# Patient Record
Sex: Female | Born: 1965
Health system: Southern US, Community
[De-identification: ages and names within clinical notes are randomized; demographics above are authoritative.]

## PROBLEM LIST (undated history)

## (undated) DIAGNOSIS — IMO0002 Reserved for concepts with insufficient information to code with codable children: Secondary | ICD-10-CM

## (undated) DIAGNOSIS — B977 Papillomavirus as the cause of diseases classified elsewhere: Secondary | ICD-10-CM

## (undated) DIAGNOSIS — M8430XA Stress fracture, unspecified site, initial encounter for fracture: Secondary | ICD-10-CM

## (undated) DIAGNOSIS — S82899A Other fracture of unspecified lower leg, initial encounter for closed fracture: Secondary | ICD-10-CM

## (undated) HISTORY — DX: Other fracture of unspecified lower leg, initial encounter for closed fracture: S82.899A

## (undated) HISTORY — DX: Reserved for concepts with insufficient information to code with codable children: IMO0002

## (undated) HISTORY — DX: Papillomavirus as the cause of diseases classified elsewhere: B97.7

## (undated) HISTORY — PX: TOTAL HIP ARTHROPLASTY: SHX124

## (undated) HISTORY — PX: TUBAL LIGATION: SHX77

## (undated) HISTORY — PX: ANKLE SURGERY: SHX546

## (undated) HISTORY — PX: COLPOSCOPY: SHX161

## (undated) HISTORY — DX: Stress fracture, unspecified site, initial encounter for fracture: M84.30XA

---

## 1998-04-06 ENCOUNTER — Other Ambulatory Visit: Admission: RE | Admit: 1998-04-06 | Discharge: 1998-04-06 | Payer: Self-pay | Admitting: *Deleted

## 1999-04-25 ENCOUNTER — Other Ambulatory Visit: Admission: RE | Admit: 1999-04-25 | Discharge: 1999-04-25 | Payer: Self-pay | Admitting: *Deleted

## 1999-06-08 ENCOUNTER — Other Ambulatory Visit: Admission: RE | Admit: 1999-06-08 | Discharge: 1999-06-08 | Payer: Self-pay | Admitting: *Deleted

## 1999-08-30 ENCOUNTER — Other Ambulatory Visit: Admission: RE | Admit: 1999-08-30 | Discharge: 1999-08-30 | Payer: Self-pay | Admitting: *Deleted

## 2001-01-29 HISTORY — PX: REFRACTIVE SURGERY: SHX103

## 2001-12-09 ENCOUNTER — Other Ambulatory Visit: Admission: RE | Admit: 2001-12-09 | Discharge: 2001-12-09 | Payer: Self-pay | Admitting: Obstetrics and Gynecology

## 2003-02-17 ENCOUNTER — Other Ambulatory Visit: Admission: RE | Admit: 2003-02-17 | Discharge: 2003-02-17 | Payer: Self-pay | Admitting: Obstetrics and Gynecology

## 2003-12-09 ENCOUNTER — Ambulatory Visit (HOSPITAL_COMMUNITY): Admission: RE | Admit: 2003-12-09 | Discharge: 2003-12-09 | Payer: Self-pay | Admitting: Obstetrics and Gynecology

## 2004-04-07 ENCOUNTER — Other Ambulatory Visit: Admission: RE | Admit: 2004-04-07 | Discharge: 2004-04-07 | Payer: Self-pay | Admitting: Obstetrics and Gynecology

## 2004-08-10 ENCOUNTER — Ambulatory Visit (HOSPITAL_BASED_OUTPATIENT_CLINIC_OR_DEPARTMENT_OTHER): Admission: RE | Admit: 2004-08-10 | Discharge: 2004-08-10 | Payer: Self-pay | Admitting: Obstetrics and Gynecology

## 2004-08-10 ENCOUNTER — Ambulatory Visit (HOSPITAL_COMMUNITY): Admission: RE | Admit: 2004-08-10 | Discharge: 2004-08-10 | Payer: Self-pay | Admitting: Obstetrics and Gynecology

## 2004-08-10 HISTORY — PX: OTHER SURGICAL HISTORY: SHX169

## 2004-12-20 ENCOUNTER — Ambulatory Visit (HOSPITAL_COMMUNITY): Admission: RE | Admit: 2004-12-20 | Discharge: 2004-12-20 | Payer: Self-pay | Admitting: Obstetrics and Gynecology

## 2005-08-22 ENCOUNTER — Other Ambulatory Visit: Admission: RE | Admit: 2005-08-22 | Discharge: 2005-08-22 | Payer: Self-pay | Admitting: Obstetrics and Gynecology

## 2005-12-31 ENCOUNTER — Ambulatory Visit (HOSPITAL_COMMUNITY): Admission: RE | Admit: 2005-12-31 | Discharge: 2005-12-31 | Payer: Self-pay | Admitting: Obstetrics and Gynecology

## 2006-08-28 ENCOUNTER — Other Ambulatory Visit: Admission: RE | Admit: 2006-08-28 | Discharge: 2006-08-28 | Payer: Self-pay | Admitting: Obstetrics and Gynecology

## 2007-01-10 ENCOUNTER — Ambulatory Visit (HOSPITAL_COMMUNITY): Admission: RE | Admit: 2007-01-10 | Discharge: 2007-01-10 | Payer: Self-pay | Admitting: Obstetrics and Gynecology

## 2007-09-12 ENCOUNTER — Other Ambulatory Visit: Admission: RE | Admit: 2007-09-12 | Discharge: 2007-09-12 | Payer: Self-pay | Admitting: Obstetrics and Gynecology

## 2008-01-13 ENCOUNTER — Ambulatory Visit (HOSPITAL_COMMUNITY): Admission: RE | Admit: 2008-01-13 | Discharge: 2008-01-13 | Payer: Self-pay | Admitting: Obstetrics and Gynecology

## 2008-06-02 ENCOUNTER — Ambulatory Visit: Payer: Self-pay | Admitting: Sports Medicine

## 2008-06-02 DIAGNOSIS — M629 Disorder of muscle, unspecified: Secondary | ICD-10-CM | POA: Insufficient documentation

## 2008-06-02 DIAGNOSIS — M25569 Pain in unspecified knee: Secondary | ICD-10-CM | POA: Insufficient documentation

## 2008-07-19 ENCOUNTER — Ambulatory Visit: Payer: Self-pay | Admitting: Sports Medicine

## 2008-07-19 DIAGNOSIS — M7750 Other enthesopathy of unspecified foot: Secondary | ICD-10-CM | POA: Insufficient documentation

## 2008-07-19 DIAGNOSIS — M2040 Other hammer toe(s) (acquired), unspecified foot: Secondary | ICD-10-CM

## 2008-07-19 DIAGNOSIS — S93409A Sprain of unspecified ligament of unspecified ankle, initial encounter: Secondary | ICD-10-CM | POA: Insufficient documentation

## 2009-01-14 ENCOUNTER — Ambulatory Visit (HOSPITAL_COMMUNITY): Admission: RE | Admit: 2009-01-14 | Discharge: 2009-01-14 | Payer: Self-pay | Admitting: Internal Medicine

## 2010-01-13 ENCOUNTER — Ambulatory Visit (HOSPITAL_COMMUNITY)
Admission: RE | Admit: 2010-01-13 | Discharge: 2010-01-13 | Payer: Self-pay | Source: Home / Self Care | Attending: Internal Medicine | Admitting: Internal Medicine

## 2010-06-16 NOTE — Op Note (Signed)
NAMEARLESIA, KIEL              ACCOUNT NO.:  [id number]   MEDICAL RECORD NO.:  [id number]          PATIENT TYPE:  AMB   LOCATION:  [facility]                         FACILITY:  [facility]   PHYSICIAN:  Cynthia P. Romine, M.D.DATE OF BIRTH:  Mar 18, 1965   DATE OF PROCEDURE:  08/10/2004  DATE OF DISCHARGE:                                 OPERATIVE REPORT   PREOPERATIVE DIAGNOSIS:  Menorrhagia.   POSTOPERATIVE DIAGNOSIS:  Menorrhagia.   PROCEDURE:  Endometrial ablation using the hydrothermal ablator.   SURGEON:  Cynthia P. Romine, M.D.   ANESTHESIA:  General by LMA.   ESTIMATED BLOOD LOSS:  25 cc.   COMPLICATIONS:  None.   PROCEDURE:  The patient was taken to the operating room and after the  induction of adequate general anesthesia by LMA was placed in the dorsal  lithotomy position and prepped and draped in the usual fashion.  A posterior  weighted and anterior Sims retractor were placed, and the cervix was grasped  on its anterior lip with a single-tooth tenaculum.  The sound was  introduced.  It would not pass through the internal os nor would the 13  Pratt dilator; therefore, the os finders were obtained, and the smallest os  finder was inserted into the os and did pass through the internal os into  the cavity.  A series of os finders were then used, and then a 13 Shawnie Pons  could be put into the endometrial cavity without difficulty.  The cervix was  then dilated to a #23 Shawnie Pons.  The scope was introduced but would not fit  through the endocervical canal easily, and the tenaculum ripped off the  anterior lip of the cervix.  The scope was withdrawn.  The cervix was re-  grasped with a tenaculum.  The cervix was dilated to a #25, and the scope  was reintroduced.  Again, the same difficulty was encountered.  The cervix  was grasped on the posterior lip and the anterior lip of the tenaculum.  The  cervix was dilated to a #27 Shawnie Pons, and the scope was introduced.  At this  point, it did pass  through the endocervix into the endometrial cavity.  Photographic documentation was taken of the endometrial cavity, including  the tubal ostia.  The scope was withdrawn to just inside the internal os,  and endometrial ablation was carried out in the usual fashion without  difficulty.  Photographic documentation was taken of the cavity at the end  of the procedure.  After the water was cooled,  the scope was withdrawn.  The instruments were removed from the vagina.  Both the anterior and  posterior lips of the cervix were noted to be bleeding from the tenaculum  sites.  These were each sutured with one suture of 2-0 chromic and  hemostasis was achieved.  The procedure was terminated.  The instruments  were removed from the vagina. The patient was taken to the recovery room in  satisfactory condition.     CPR/MEDQ  D:  08/10/2004  T:  08/10/2004  Job:  [id number]

## 2011-01-17 ENCOUNTER — Other Ambulatory Visit: Payer: Self-pay | Admitting: Obstetrics and Gynecology

## 2011-01-17 DIAGNOSIS — Z139 Encounter for screening, unspecified: Secondary | ICD-10-CM

## 2011-02-02 ENCOUNTER — Ambulatory Visit (HOSPITAL_COMMUNITY)
Admission: RE | Admit: 2011-02-02 | Discharge: 2011-02-02 | Disposition: A | Discharge status: Home / Self Care | Payer: BC Managed Care – PPO | Source: Ambulatory Visit | Attending: Obstetrics and Gynecology | Admitting: Obstetrics and Gynecology

## 2011-02-02 DIAGNOSIS — Z1231 Encounter for screening mammogram for malignant neoplasm of breast: Secondary | ICD-10-CM | POA: Insufficient documentation

## 2011-02-02 DIAGNOSIS — Z139 Encounter for screening, unspecified: Secondary | ICD-10-CM

## 2011-10-30 DIAGNOSIS — B977 Papillomavirus as the cause of diseases classified elsewhere: Secondary | ICD-10-CM

## 2011-10-30 HISTORY — DX: Papillomavirus as the cause of diseases classified elsewhere: B97.7

## 2012-01-17 ENCOUNTER — Other Ambulatory Visit: Payer: Self-pay | Admitting: Obstetrics and Gynecology

## 2012-01-17 DIAGNOSIS — Z139 Encounter for screening, unspecified: Secondary | ICD-10-CM

## 2012-02-04 ENCOUNTER — Ambulatory Visit (HOSPITAL_COMMUNITY): Payer: BC Managed Care – PPO

## 2012-02-08 ENCOUNTER — Ambulatory Visit (HOSPITAL_COMMUNITY)
Admission: RE | Admit: 2012-02-08 | Discharge: 2012-02-08 | Disposition: A | Discharge status: Home / Self Care | Payer: BC Managed Care – PPO | Source: Ambulatory Visit | Attending: Obstetrics and Gynecology | Admitting: Obstetrics and Gynecology

## 2012-02-08 DIAGNOSIS — Z139 Encounter for screening, unspecified: Secondary | ICD-10-CM

## 2012-02-08 DIAGNOSIS — Z1231 Encounter for screening mammogram for malignant neoplasm of breast: Secondary | ICD-10-CM | POA: Insufficient documentation

## 2012-11-27 ENCOUNTER — Telehealth: Payer: Self-pay | Admitting: Obstetrics and Gynecology

## 2012-11-27 NOTE — Telephone Encounter (Signed)
Patient needs refill for lexapro? She thinks that is what the name is. She has an appt scheduled for 12/16/12 but doesn't have enough to get her until then

## 2012-11-28 NOTE — Telephone Encounter (Signed)
rx called to pharmacy at [phone]. Citalopram (celexa) 20mg  #30 with 0 refills

## 2012-11-28 NOTE — Telephone Encounter (Signed)
In chart it states pt has been taking celexa pt says that celexa is right. aex is 12-16-12 & pt wants it sent to [facility] in [place]

## 2012-12-12 ENCOUNTER — Encounter: Payer: Self-pay | Admitting: Certified Nurse Midwife

## 2012-12-16 ENCOUNTER — Encounter: Payer: Self-pay | Admitting: Certified Nurse Midwife

## 2012-12-16 ENCOUNTER — Ambulatory Visit (INDEPENDENT_AMBULATORY_CARE_PROVIDER_SITE_OTHER): Payer: BC Managed Care – PPO | Admitting: Certified Nurse Midwife

## 2012-12-16 VITALS — BP 94/60 | HR 76 | Resp 16 | Ht 64.0 in | Wt 122.0 lb

## 2012-12-16 DIAGNOSIS — Z01419 Encounter for gynecological examination (general) (routine) without abnormal findings: Secondary | ICD-10-CM

## 2012-12-16 DIAGNOSIS — Z0000 Encounter for general adult medical examination without abnormal findings: Secondary | ICD-10-CM

## 2012-12-16 DIAGNOSIS — B009 Herpesviral infection, unspecified: Secondary | ICD-10-CM

## 2012-12-16 DIAGNOSIS — R6889 Other general symptoms and signs: Secondary | ICD-10-CM

## 2012-12-16 DIAGNOSIS — IMO0002 Reserved for concepts with insufficient information to code with codable children: Secondary | ICD-10-CM

## 2012-12-16 DIAGNOSIS — F411 Generalized anxiety disorder: Secondary | ICD-10-CM

## 2012-12-16 LAB — POCT URINALYSIS DIPSTICK
Bilirubin, UA: NEGATIVE
Blood, UA: NEGATIVE
Glucose, UA: NEGATIVE
Ketones, UA: NEGATIVE
Leukocytes, UA: NEGATIVE
Nitrite, UA: NEGATIVE
Protein, UA: NEGATIVE
Urobilinogen, UA: NEGATIVE
pH, UA: 5

## 2012-12-16 MED ORDER — CITALOPRAM HYDROBROMIDE 20 MG PO TABS: 20.0000 mg | ORAL_TABLET | Freq: Every day | ORAL | Status: DC

## 2012-12-16 MED ORDER — VALACYCLOVIR HCL 500 MG PO TABS: 500.0000 mg | ORAL_TABLET | Freq: Two times a day (BID) | ORAL | Status: DC

## 2012-12-16 NOTE — Progress Notes (Signed)
47 y.o. [id number] Married Caucasian Fe here for annual exam.  Periods none since HTA. Occasional symptom of menses, otherwise none. Sees PCP prn. No health issues today.Patient has chronic cold sores Valtrex working well for limiting outbreak. Celexa working well for anxiety. "Feel so much better" desires continuance.  Patient's last menstrual period was 01/30/2004.          Sexually active: yes  The current method of family planning is tubal ligation.    Exercising: yes  cardio & weights Smoker:  no  Health Maintenance: Pap:  11-21-11 neg ,HPV +, genotype 16 & 18 not detected MMG:  1/14 neg. Colonoscopy:  none BMD:   none TDaP:  2009 Labs: Poct urine-neg Self breast exam: done occ   reports that she has never smoked. She does not have any smokeless tobacco history on file. She reports that she does not drink alcohol or use illicit drugs.  Past Medical History  Diagnosis Date  . High risk HPV infection 10/13    neg pap, +HR HPV, 16/18 genotype neg    Past Surgical History  Procedure Laterality Date  . Hta (ablation)  08/10/04  . Tubal ligation      BTSP  . Refractive surgery  2003    Current Outpatient Prescriptions  Medication Sig Dispense Refill  . CALCIUM PO Take by mouth daily.      . citalopram (CELEXA) 20 MG tablet Take 20 mg by mouth daily.      . Multiple Vitamins-Minerals (MULTIVITAMIN PO) Take by mouth daily.      . Omega-3 Fatty Acids (FISH OIL PO) Take by mouth daily.       No current facility-administered medications for this visit.    Family History  Problem Relation Age of Onset  . Aneurysm Mother   . Hypertension Mother   . Thyroid disease Mother   . Hypertension Sister   . Cancer Maternal Uncle     lung    ROS:  Pertinent items are noted in HPI.  Otherwise, a comprehensive ROS was negative.  Exam:   BP 94/60  Pulse 76  Resp 16  Ht 5\' 4"  (1.626 m)  Wt 122 lb (55.339 kg)  BMI 20.93 kg/m2  LMP 01/30/2004 Height: 5\' 4"  (162.6 cm)  Ht Readings  from Last 3 Encounters:  12/16/12 5\' 4"  (1.626 m)  06/02/08 5\' 4"  (1.626 m)    General appearance: alert, cooperative and appears stated age Head: Normocephalic, without obvious abnormality, atraumatic Neck: no adenopathy, supple, symmetrical, trachea midline and thyroid normal to inspection and palpation Lungs: clear to auscultation bilaterally Breasts: normal appearance, no masses or tenderness, No nipple retraction or dimpling, No nipple discharge or bleeding, No axillary or supraclavicular adenopathy Heart: regular rate and rhythm Abdomen: soft, non-tender; no masses,  no organomegaly Extremities: extremities normal, atraumatic, no cyanosis or edema Skin: Skin color, texture, turgor normal. No rashes or lesions Lymph nodes: Cervical, supraclavicular, and axillary nodes normal. No abnormal inguinal nodes palpated Neurologic: Grossly normal   Pelvic: External genitalia:  no lesions              Urethra:  normal appearing urethra with no masses, tenderness or lesions              Bartholin's and Skene's: normal                 Vagina: normal appearing vagina with normal color and discharge, no lesions  Cervix: normal, non tender              Pap taken: yes Bimanual Exam:  Uterus:  normal size, contour, position, consistency, mobility, non-tender and retroverted              Adnexa: normal adnexa and no mass, fullness, tenderness               Rectovaginal: Confirms               Anus:  normal sphincter tone, no lesions  A:  Well Woman with normal exam  Amenorrhea due to HTA for menorrhagia  Cold sore history needs Rx for Valtrex  Anxiety, Celexa working well desires continuance  History abnormal Pap 2013 + HPVHR, -16,18  P:   Reviewed health and wellness pertinent to exam  Rx Valtrex see order  Rx Celexa see order  Schedule fasting labs  Pap smear as per guidelines   Mammogram yearly pap smear taken today with HPVHR  counseled on breast self exam, mammography  screening, adequate intake of calcium and vitamin D, diet and exercise  return annually or prn  An After Visit Summary was printed and given to the patient.

## 2012-12-16 NOTE — Patient Instructions (Signed)

## 2012-12-18 NOTE — Progress Notes (Signed)
Note reviewed, agree with plan.  Rishabh Rinkenberger, MD  

## 2012-12-19 LAB — IPS PAP TEST WITH HPV

## 2012-12-22 ENCOUNTER — Other Ambulatory Visit: Payer: Self-pay | Admitting: Certified Nurse Midwife

## 2012-12-22 ENCOUNTER — Other Ambulatory Visit (INDEPENDENT_AMBULATORY_CARE_PROVIDER_SITE_OTHER): Payer: BC Managed Care – PPO

## 2012-12-22 DIAGNOSIS — IMO0002 Reserved for concepts with insufficient information to code with codable children: Secondary | ICD-10-CM

## 2012-12-22 DIAGNOSIS — Z0000 Encounter for general adult medical examination without abnormal findings: Secondary | ICD-10-CM

## 2012-12-22 LAB — COMPREHENSIVE METABOLIC PANEL
ALT: 21 U/L (ref 0–35)
AST: 33 U/L (ref 0–37)
Albumin: 4.5 g/dL (ref 3.5–5.2)
Alkaline Phosphatase: 40 U/L (ref 39–117)
BUN: 16 mg/dL (ref 6–23)
CO2: 28 mEq/L (ref 19–32)
Calcium: 9.9 mg/dL (ref 8.4–10.5)
Chloride: 100 mEq/L (ref 96–112)
Creat: 0.91 mg/dL (ref 0.50–1.10)
Glucose, Bld: 85 mg/dL (ref 70–99)
Potassium: 4.5 mEq/L (ref 3.5–5.3)
Sodium: 141 mEq/L (ref 135–145)
Total Bilirubin: 0.7 mg/dL (ref 0.3–1.2)
Total Protein: 7 g/dL (ref 6.0–8.3)

## 2012-12-22 LAB — TSH: TSH: 0.631 u[IU]/mL (ref 0.350–4.500)

## 2012-12-22 LAB — LIPID PANEL
Cholesterol: 138 mg/dL (ref 0–200)
HDL: 64 mg/dL (ref >39)
LDL Cholesterol: 63 mg/dL (ref 0–99)
Total CHOL/HDL Ratio: 2.2 Ratio
Triglycerides: 53 mg/dL (ref <150)
VLDL: 11 mg/dL (ref 0–40)

## 2012-12-23 LAB — VITAMIN D 25 HYDROXY (VIT D DEFICIENCY, FRACTURES): Vit D, 25-Hydroxy: 54 ng/mL (ref 30–89)

## 2012-12-29 ENCOUNTER — Telehealth: Payer: Self-pay | Admitting: Emergency Medicine

## 2012-12-29 NOTE — Telephone Encounter (Signed)
Spoke with patient and message from Verner Chol CNM given. Patient agreeable to colposcopy and instructions given. Colposcopy pre-procedure instructions given. Motrin instructions given. Motrin=Advil=Ibuprofen Can take 800 mg (Can purchase over the counter, you will need four 200 mg pills) every 8 hours as needed.  Take with food. Make sure to eat a meal before appointment and drink plenty of fluids. Patient verbalized understanding. Does not currently have menses.    Patient requests to wait until 01/2013 for insurance purposes to have colposcopy scheduled. Appointment scheduled at [facility] for 01/30/13.   Carolynn can you precert

## 2012-12-29 NOTE — Addendum Note (Signed)
Addended by: Joeseph Amor on: 12/29/2012 10:08 AM   Modules accepted: Orders

## 2012-12-29 NOTE — Telephone Encounter (Signed)
Message copied by Joeseph Amor on Mon Dec 29, 2012  9:48 AM ------      Message from: Verner Chol      Created: Mon Dec 22, 2012  9:40 PM       Notify patient pap smear abnormal LSIL and colposcopy necessary.      Order in ------

## 2013-01-28 ENCOUNTER — Ambulatory Visit: Payer: BC Managed Care – PPO | Admitting: Certified Nurse Midwife

## 2013-01-30 ENCOUNTER — Ambulatory Visit (INDEPENDENT_AMBULATORY_CARE_PROVIDER_SITE_OTHER): Payer: BC Managed Care – PPO | Admitting: Certified Nurse Midwife

## 2013-01-30 ENCOUNTER — Encounter: Payer: Self-pay | Admitting: Certified Nurse Midwife

## 2013-01-30 VITALS — BP 106/64 | HR 68 | Resp 16 | Ht 64.0 in | Wt 122.0 lb

## 2013-01-30 DIAGNOSIS — R6889 Other general symptoms and signs: Secondary | ICD-10-CM

## 2013-01-30 DIAGNOSIS — IMO0002 Reserved for concepts with insufficient information to code with codable children: Secondary | ICD-10-CM

## 2013-01-30 NOTE — Progress Notes (Signed)
12-16-12 Lgsil HPV HR not detected Previous HPV positive but 16/18 genotype negative in 2013 Pt took 800mg  ibuprofen at 1pm

## 2013-01-30 NOTE — Progress Notes (Addendum)
Patient ID: Nicole Carlson, female   DOB: [birth year], 48 y.o.   MRN: [id number]  Chief Complaint  Patient presents with  . Colposcopy    HPI Nicole Carlson is a 48 y.o. female.  [id number] married here for colposcopy. Denies vaginal bleeding or vaginal pain or discharge  HPI  Indications: Pap smear on 11/18 2014 showed: low-grade squamous intraepithelial neoplasia (LGSIL - encompassing HPV,mild dysplasia,CIN I). Previous pap 11/13 showed normal pap smear and positive HPVHR, but negative 16,18  Past Medical History  Diagnosis Date  . High risk HPV infection 10/13    neg pap, +HR HPV, 16/18 genotype neg  . LGSIL (low grade squamous intraepithelial dysplasia)     12/14 HPV HR not detected    Past Surgical History  Procedure Laterality Date  . Hta (ablation)  08/10/04  . Tubal ligation      BTSP  . Refractive surgery  2003    Family History  Problem Relation Age of Onset  . Aneurysm Mother   . Hypertension Mother   . Thyroid disease Mother   . Hypertension Sister   . Cancer Maternal Uncle     lung    Social History History  Substance Use Topics  . Smoking status: Never Smoker   . Smokeless tobacco: Not on file  . Alcohol Use: No    No Known Allergies  Current Outpatient Prescriptions  Medication Sig Dispense Refill  . CALCIUM PO Take by mouth daily.      . citalopram (CELEXA) 20 MG tablet Take 1 tablet (20 mg total) by mouth daily.  30 tablet  12  . Multiple Vitamins-Minerals (MULTIVITAMIN PO) Take by mouth daily.      . Omega-3 Fatty Acids (FISH OIL PO) Take by mouth daily.      . valACYclovir (VALTREX) 500 MG tablet Take 1 tablet (500 mg total) by mouth 2 (two) times daily.  30 tablet  12   No current facility-administered medications for this visit.    Review of Systems Review of Systems  Constitutional: Negative.   Genitourinary: Negative for vaginal bleeding, vaginal discharge and vaginal pain.    Blood pressure 106/64, pulse 68, resp. rate 16, height  5\' 4"  (1.626 m), weight 122 lb (55.339 kg), last menstrual period 01/30/2004.  Physical Exam Physical Exam  Constitutional: She is oriented to person, place, and time. She appears well-developed and well-nourished.  Genitourinary: Vagina normal.    Neurological: She is alert and oriented to person, place, and time.  Skin: Skin is warm and dry.  Psychiatric: She has a normal mood and affect. Judgment normal.    Data Reviewed Reviewed pap smear results with patient  Assessment   Healthy female with history of LSIL here for colposcopy Procedure Details  The risks and benefits of the procedure and Written informed consent obtained.  Speculum placed in vagina and excellent visualization of cervix achieved, cervix swabbed x 3 with saline wash and acetic acid solution. Acetowhite lesion noted at 5 o'clock. Viewed with 3.75,7.5,15 # and green filter. Lugol's applied and non staining also noted in same area. Biopsy taken at 5 o'clock and ECC taken . Monsel's applied. No active bleeding noted on speculum removal. Patient tolerated procedure well. Instructions given.  Specimens: 2  Complications: none.     Plan    Specimens labelled and sent to Pathology. Patient will be notified of results once reviewed.   Pathology reviewed and  Biopsy showed LSIL, mild squamous dysplasia with HPV effect, CIN 1 ECC  showed benign glandular tissues with squamous metaplasia, but no dysplasia or HPV effect. Findings correlate with pap smear. Patient to be notified of results and need for follow up pap in one year. Pap recall 08   [facility] HospitalEONARD,DEBORAH 01/30/2013, 3:05 PM

## 2013-01-30 NOTE — Patient Instructions (Signed)

## 2013-02-02 NOTE — Progress Notes (Signed)
Encounter reviewed by Dr. Lachina Salsberry Silva.  

## 2013-02-04 LAB — IPS CERVICAL/ECC/EMB/VULVAR/VAGINAL BIOPSY

## 2013-02-13 ENCOUNTER — Telehealth: Payer: Self-pay | Admitting: Certified Nurse Midwife

## 2013-02-13 NOTE — Telephone Encounter (Signed)
Notes Recorded by Verner Choleborah S Leonard, CNM on 02/05/2013 at 12:08 PM Notify patient that pathology from biopsy showed LSIL, mild squamous dysplasia with HPV effect, CIN 1 Ecc showed benign glandular tissue with no dysplasia or HPV effect. Findings correlate with the pap smear results Needs repeat pap smear in one year very important Pap recall 08

## 2013-02-13 NOTE — Telephone Encounter (Signed)
Message left to return call to Nicole Carlson at [phone].    

## 2013-02-13 NOTE — Telephone Encounter (Signed)
Patient is calling regarding a colpo that was done two weeks ago. Has heard anything wants to be called back.

## 2013-02-16 ENCOUNTER — Other Ambulatory Visit: Payer: Self-pay | Admitting: Certified Nurse Midwife

## 2013-02-16 DIAGNOSIS — Z139 Encounter for screening, unspecified: Secondary | ICD-10-CM

## 2013-02-16 NOTE — Telephone Encounter (Signed)
Call right back to patient, (based on call log, we were calling each other at same time). Reviewed path report from Debbi and need for repeat pap in one year.  Stressed importance of follow-up, patient agreeable to follow-up, states she cant schedule one year ahead but that she is very diligent to come for yearly exam. 08 recall already entered for 01-29-14.

## 2013-02-16 NOTE — Telephone Encounter (Signed)
Call back to patient, LMTCB.   VM has number confirmation/ LM calling with good report.

## 2013-02-16 NOTE — Telephone Encounter (Signed)
Advised patient was holding. Picked up line and no one there. Attempted to return phone call and voicemail received.

## 2013-02-16 NOTE — Telephone Encounter (Signed)
Returning a call to Tracy °

## 2013-02-19 ENCOUNTER — Ambulatory Visit (HOSPITAL_COMMUNITY)
Admission: RE | Admit: 2013-02-19 | Discharge: 2013-02-19 | Disposition: A | Discharge status: Home / Self Care | Payer: BC Managed Care – PPO | Source: Ambulatory Visit | Attending: Certified Nurse Midwife | Admitting: Certified Nurse Midwife

## 2013-02-19 DIAGNOSIS — Z139 Encounter for screening, unspecified: Secondary | ICD-10-CM

## 2013-02-19 DIAGNOSIS — Z1231 Encounter for screening mammogram for malignant neoplasm of breast: Secondary | ICD-10-CM | POA: Insufficient documentation

## 2013-11-30 ENCOUNTER — Encounter: Payer: Self-pay | Admitting: Certified Nurse Midwife

## 2014-01-13 ENCOUNTER — Other Ambulatory Visit: Payer: Self-pay

## 2014-01-13 DIAGNOSIS — F411 Generalized anxiety disorder: Secondary | ICD-10-CM

## 2014-01-13 MED ORDER — CITALOPRAM HYDROBROMIDE 20 MG PO TABS: 20.0000 mg | ORAL_TABLET | Freq: Every day | ORAL | Status: DC

## 2014-01-13 NOTE — Telephone Encounter (Signed)
Medication refill request: Celexa 20mg  Last AEX:  12/16/12 Next AEX: 03/15/14 Last MMG (if hormonal medication request): NA Refill authorized: Until pt AEX on 03/15/14

## 2014-01-25 ENCOUNTER — Other Ambulatory Visit: Payer: Self-pay | Admitting: Certified Nurse Midwife

## 2014-01-25 DIAGNOSIS — Z1231 Encounter for screening mammogram for malignant neoplasm of breast: Secondary | ICD-10-CM

## 2014-02-25 ENCOUNTER — Ambulatory Visit (HOSPITAL_COMMUNITY)
Admission: RE | Admit: 2014-02-25 | Discharge: 2014-02-25 | Disposition: A | Discharge status: Home / Self Care | Payer: BLUE CROSS/BLUE SHIELD | Source: Ambulatory Visit | Attending: Certified Nurse Midwife | Admitting: Certified Nurse Midwife

## 2014-02-25 DIAGNOSIS — Z1231 Encounter for screening mammogram for malignant neoplasm of breast: Secondary | ICD-10-CM | POA: Diagnosis not present

## 2014-03-15 ENCOUNTER — Ambulatory Visit (INDEPENDENT_AMBULATORY_CARE_PROVIDER_SITE_OTHER): Payer: BLUE CROSS/BLUE SHIELD | Admitting: Certified Nurse Midwife

## 2014-03-15 ENCOUNTER — Ambulatory Visit: Payer: Self-pay | Admitting: Certified Nurse Midwife

## 2014-03-15 ENCOUNTER — Encounter: Payer: Self-pay | Admitting: Certified Nurse Midwife

## 2014-03-15 VITALS — BP 120/72 | HR 70 | Resp 16 | Ht 63.75 in | Wt 123.0 lb

## 2014-03-15 DIAGNOSIS — B009 Herpesviral infection, unspecified: Secondary | ICD-10-CM

## 2014-03-15 DIAGNOSIS — F411 Generalized anxiety disorder: Secondary | ICD-10-CM

## 2014-03-15 DIAGNOSIS — Z01419 Encounter for gynecological examination (general) (routine) without abnormal findings: Secondary | ICD-10-CM

## 2014-03-15 DIAGNOSIS — Z124 Encounter for screening for malignant neoplasm of cervix: Secondary | ICD-10-CM

## 2014-03-15 MED ORDER — VALACYCLOVIR HCL 500 MG PO TABS: 500.0000 mg | ORAL_TABLET | Freq: Two times a day (BID) | ORAL | Status: DC

## 2014-03-15 MED ORDER — CITALOPRAM HYDROBROMIDE 20 MG PO TABS: 20.0000 mg | ORAL_TABLET | Freq: Every day | ORAL | Status: DC

## 2014-03-15 NOTE — Patient Instructions (Signed)

## 2014-03-15 NOTE — Progress Notes (Signed)
Reviewed personally.  M. Suzanne Britteny Fiebelkorn, MD.  

## 2014-03-15 NOTE — Progress Notes (Signed)
49 y.o. Nicole Carlson Married  Caucasian Fe here for annual exam. Periods none since ablation. Occasional hot flashes, no issues. Celexa working well for anxiety. Had 2 -3 outbreaks of HSV 1 oral, needs Valtrex refill. Sees PCP prn. Plans colonoscopy this year, but not sure if she will use Dr. Loreta Ave, spouse has had one in [place]. Eating well, and exercising. Feels well, except for job stress of being bought out, unsure of her job status afterward. No health issues today. Needs form for office of exam done today.    No LMP recorded. Patient has had an ablation.          Sexually active: Yes.    The current method of family planning is tubal ligation.    Exercising: Yes.    cardio & weights Smoker:  no  Health Maintenance: Pap:  12-16-12 LGSIL, colpo 01-30-13 CIN1 MMG:  02-25-14 category b density,birads 1:neg Colonoscopy:  none BMD:   none TDaP:  2009 Labs: none Self breast exam: done monthly   reports that she has never smoked. She does not have any smokeless tobacco history on file. She reports that she does not drink alcohol or use illicit drugs.  Past Medical History  Diagnosis Date  . High risk HPV infection 10/13    neg pap, +HR HPV, 16/18 genotype neg  . LGSIL (low grade squamous intraepithelial dysplasia)     12/14 HPV HR not detected    Past Surgical History  Procedure Laterality Date  . Hta (ablation)  08/10/04  . Tubal ligation      BTSP  . Refractive surgery  2003  . Colposcopy      Current Outpatient Prescriptions  Medication Sig Dispense Refill  . CALCIUM PO Take by mouth daily.    . citalopram (CELEXA) 20 MG tablet Take 1 tablet (20 mg total) by mouth daily. 30 tablet 2  . Multiple Vitamins-Minerals (MULTIVITAMIN PO) Take by mouth daily.    . Omega-3 Fatty Acids (FISH OIL PO) Take by mouth daily.    . valACYclovir (VALTREX) 500 MG tablet Take 1 tablet (500 mg total) by mouth 2 (two) times daily. 30 tablet 12   No current facility-administered medications for this  visit.    Family History  Problem Relation Age of Onset  . Aneurysm Mother   . Hypertension Mother   . Thyroid disease Mother   . Hypertension Sister   . Cancer Maternal Uncle     lung    ROS:  Pertinent items are noted in HPI.  Otherwise, a comprehensive ROS was negative.  Exam:   BP 120/72 mmHg  Pulse 70  Resp 16  Ht 5' 3.75" (1.619 m)  Wt 123 lb (55.792 kg)  BMI 21.29 kg/m2  LMP  Height: 5' 3.75" (161.9 cm) Ht Readings from Last 3 Encounters:  03/15/14 5' 3.75" (1.619 m)  01/30/13  (1.626 m)  12/16/12  (1.626 m)    General appearance: alert, cooperative and appears stated age Head: Normocephalic, without obvious abnormality, atraumatic Neck: no adenopathy, supple, symmetrical, trachea midline and thyroid normal to inspection and palpation Lungs: clear to auscultation bilaterally Breasts: normal appearance, no masses or tenderness, No nipple retraction or dimpling, No nipple discharge or bleeding, No axillary or supraclavicular adenopathy Heart: regular rate and rhythm Abdomen: soft, non-tender; no masses,  no organomegaly Extremities: extremities normal, atraumatic, no cyanosis or edema Skin: Skin color, texture, turgor normal. No rashes or lesions Lymph nodes: Cervical, supraclavicular, and axillary nodes normal. No abnormal inguinal  nodes palpated Neurologic: Grossly normal   Pelvic: External genitalia:  no lesions              Urethra:  normal appearing urethra with no masses, tenderness or lesions              Bartholin's and Skene's: normal                 Vagina: normal appearing vagina with normal color and discharge, no lesions              Cervix: normal, non tender, no lesions, spotting with pap only              Pap taken: Yes.   Bimanual Exam:  Uterus:  normal size, contour, position, consistency, mobility, non-tender              Adnexa: normal adnexa               Rectovaginal: Confirms               Anus:  normal sphincter tone, no  lesions  Chaperone present: Yes  A:  Well Woman with normal exam  Contraception BTL  Anxiety Celexa working well desires continuance  HSV1 oral history Valtrex for prn use  History of LSIL with CIN 1 on colpo follow up pap today  P:   Reviewed health and wellness pertinent to exam  Discussed perimenopausal and etiology and expectations. Patient will advise if hot flashes continue.  Rx Celexa see order  Rx Valtrex see order  Pap smear taken today with HPVHR, if negative repeat in one year, if not per results  Discussed risks and benefits of colonoscopy, patient will decide who she will use and advise   counseled on breast self exam, mammography screening, adequate intake of calcium and vitamin D, diet and exercise  return annually or prn  An After Visit Summary was printed and given to the patient.

## 2014-03-17 LAB — IPS PAP TEST WITH HPV

## 2014-05-12 ENCOUNTER — Telehealth: Payer: Self-pay | Admitting: Certified Nurse Midwife

## 2014-05-12 NOTE — Telephone Encounter (Signed)
Left message regarding upcoming appointment has been canceled and needs to be rescheduled. °

## 2014-12-21 ENCOUNTER — Other Ambulatory Visit: Payer: Self-pay | Admitting: Certified Nurse Midwife

## 2014-12-21 DIAGNOSIS — Z1231 Encounter for screening mammogram for malignant neoplasm of breast: Secondary | ICD-10-CM

## 2015-03-17 ENCOUNTER — Other Ambulatory Visit: Payer: Self-pay | Admitting: Certified Nurse Midwife

## 2015-03-17 ENCOUNTER — Ambulatory Visit (INDEPENDENT_AMBULATORY_CARE_PROVIDER_SITE_OTHER): Payer: BLUE CROSS/BLUE SHIELD | Admitting: Certified Nurse Midwife

## 2015-03-17 ENCOUNTER — Encounter: Payer: Self-pay | Admitting: Certified Nurse Midwife

## 2015-03-17 VITALS — BP 120/76 | HR 70 | Resp 16 | Ht 63.75 in | Wt 121.0 lb

## 2015-03-17 DIAGNOSIS — Z124 Encounter for screening for malignant neoplasm of cervix: Secondary | ICD-10-CM

## 2015-03-17 DIAGNOSIS — Z01419 Encounter for gynecological examination (general) (routine) without abnormal findings: Secondary | ICD-10-CM | POA: Diagnosis not present

## 2015-03-17 DIAGNOSIS — Z0000 Encounter for general adult medical examination without abnormal findings: Secondary | ICD-10-CM | POA: Diagnosis not present

## 2015-03-17 DIAGNOSIS — B009 Herpesviral infection, unspecified: Secondary | ICD-10-CM | POA: Diagnosis not present

## 2015-03-17 DIAGNOSIS — N951 Menopausal and female climacteric states: Secondary | ICD-10-CM

## 2015-03-17 LAB — COMPREHENSIVE METABOLIC PANEL
ALK PHOS: 49 U/L (ref 33–130)
ALT: 39 U/L — AB (ref 6–29)
AST: 27 U/L (ref 10–35)
Albumin: 4.5 g/dL (ref 3.6–5.1)
BILIRUBIN TOTAL: 0.6 mg/dL (ref 0.2–1.2)
BUN: 22 mg/dL (ref 7–25)
CALCIUM: 9.6 mg/dL (ref 8.6–10.4)
CO2: 29 mmol/L (ref 20–31)
Chloride: 101 mmol/L (ref 98–110)
Creat: 0.89 mg/dL (ref 0.50–1.05)
GLUCOSE: 80 mg/dL (ref 65–99)
POTASSIUM: 4.4 mmol/L (ref 3.5–5.3)
Sodium: 139 mmol/L (ref 135–146)
TOTAL PROTEIN: 6.9 g/dL (ref 6.1–8.1)

## 2015-03-17 LAB — CBC
HCT: 44.4 % (ref 36.0–46.0)
Hemoglobin: 14.3 g/dL (ref 12.0–15.0)
MCH: 27.6 pg (ref 26.0–34.0)
MCHC: 32.2 g/dL (ref 30.0–36.0)
MCV: 85.5 fL (ref 78.0–100.0)
MPV: 9.9 fL (ref 8.6–12.4)
PLATELETS: 204 10*3/uL (ref 150–400)
RBC: 5.19 MIL/uL — AB (ref 3.87–5.11)
RDW: 12.9 % (ref 11.5–15.5)
WBC: 6.5 10*3/uL (ref 4.0–10.5)

## 2015-03-17 LAB — LIPID PANEL
CHOL/HDL RATIO: 2.8 ratio (ref <=5.0)
CHOLESTEROL: 147 mg/dL (ref 125–200)
HDL: 53 mg/dL (ref >=46)
LDL Cholesterol: 73 mg/dL (ref <130)
Triglycerides: 107 mg/dL (ref <150)
VLDL: 21 mg/dL (ref <30)

## 2015-03-17 LAB — TSH: TSH: 0.76 m[IU]/L

## 2015-03-17 MED ORDER — VALACYCLOVIR HCL 500 MG PO TABS: 500.0000 mg | ORAL_TABLET | Freq: Two times a day (BID) | ORAL | Status: DC

## 2015-03-17 NOTE — Patient Instructions (Signed)

## 2015-03-17 NOTE — Progress Notes (Signed)
50 y.o. [id number] Married  Caucasian Fe here for annual exam.  No periods since ablation. Having occasional hot flashes and night sweats. No insomnia issues. Sees Urgent care if needed. Having more frequent HSV1 oral outbreaks with stress on upcoming job loss, needs Rx refill. Spouse supportive during this time of stress.  Still exercising which helps with life changes. No other health issues today. Desires screening labs today.  No LMP recorded. Patient has had an ablation.          Sexually active: Yes.    The current method of family planning is tubal ligation.    Exercising: Yes.    walk & run,gym Smoker:  no  Health Maintenance: Pap:  03-15-14 neg HPV HR neg colpo 01-30-13 CIN1 MMG:  02-25-14 category b density,birads 1:neg Colonoscopy: 12/16 normal f/u 49yrs BMD:   none TDaP:  2009 Shingles: no Pneumonia: no Hep C and HIV: not done Labs: none Self breast exam: done occ   reports that she has never smoked. She does not have any smokeless tobacco history on file. She reports that she does not drink alcohol or use illicit drugs.  Past Medical History  Diagnosis Date  . High risk HPV infection 10/13    neg pap, +HR HPV, 16/18 genotype neg  . LGSIL (low grade squamous intraepithelial dysplasia)     12/14 HPV HR not detected    Past Surgical History  Procedure Laterality Date  . Hta (ablation)  08/10/04  . Tubal ligation      BTSP  . Refractive surgery  2003  . Colposcopy      Current Outpatient Prescriptions  Medication Sig Dispense Refill  . CALCIUM PO Take by mouth daily.    . Multiple Vitamins-Minerals (MULTIVITAMIN PO) Take by mouth daily.    . Omega-3 Fatty Acids (FISH OIL PO) Take by mouth daily.    . valACYclovir (VALTREX) 500 MG tablet Take 1 tablet (500 mg total) by mouth 2 (two) times daily. 30 tablet 12   No current facility-administered medications for this visit.    Family History  Problem Relation Age of Onset  . Aneurysm Mother   . Hypertension Mother    . Thyroid disease Mother   . Hypertension Sister   . Cancer Maternal Uncle     lung    ROS:  Pertinent items are noted in HPI.  Otherwise, a comprehensive ROS was negative.  Exam:   BP 120/76 mmHg  Pulse 70  Resp 16  Ht 5' 3.75" (1.619 m)  Wt 121 lb (54.885 kg)  BMI 20.94 kg/m2 Height: 5' 3.75" (161.9 cm) Ht Readings from Last 3 Encounters:  03/17/15 5' 3.75" (1.619 m)  03/15/14 5' 3.75" (1.619 m)  01/30/13  (1.626 m)    General appearance: alert, cooperative and appears stated age Head: Normocephalic, without obvious abnormality, atraumatic Neck: no adenopathy, supple, symmetrical, trachea midline and thyroid normal to inspection and palpation Lungs: clear to auscultation bilaterally Breasts: normal appearance, no masses or tenderness, No nipple retraction or dimpling, No nipple discharge or bleeding, No axillary or supraclavicular adenopathy Heart: regular rate and rhythm Abdomen: soft, non-tender; no masses,  no organomegaly Extremities: extremities normal, atraumatic, no cyanosis or edema Skin: Skin color, texture, turgor normal. No rashes or lesions Lymph nodes: Cervical, supraclavicular, and axillary nodes normal. No abnormal inguinal nodes palpated Neurologic: Grossly normal   Pelvic: External genitalia:  no lesions              Urethra:  normal  appearing urethra with no masses, tenderness or lesions              Bartholin's and Skene's: normal                 Vagina: normal appearing vagina with normal color and discharge, no lesions              Cervix: no cervical motion tenderness, no lesions and normal              Pap taken: Yes.   Bimanual Exam:  Uterus:  normal size, contour, position, consistency, mobility, non-tender              Adnexa: normal adnexa and no mass, fullness, tenderness               Rectovaginal: Confirms               Anus:  normal sphincter tone, no lesions  Chaperone present: yes  A:  Well Woman with normal  exam  Contraception BTL  Menopausal symptoms   Screening labs  Social stress with job loss  History of oral HSV 1 needs Valtrex refill  P:   Reviewed health and wellness pertinent to exam  Discussed peri menopause and etiology and expectations. Questions addressed.  Lab:CBC,CMP,FSH,Hep C, Lipid panel STD panel, TSH, Vitamin D  Rx Valtrex see order  Pap smear as above with HPV reflex   counseled on breast self exam, mammography screening, menopause, adequate intake of calcium and vitamin D, diet and exercise  return annually or prn  An After Visit Summary was printed and given to the patient.

## 2015-03-18 ENCOUNTER — Other Ambulatory Visit: Payer: Self-pay | Admitting: Certified Nurse Midwife

## 2015-03-18 DIAGNOSIS — R899 Unspecified abnormal finding in specimens from other organs, systems and tissues: Secondary | ICD-10-CM

## 2015-03-18 LAB — STD PANEL
HIV 1&2 Ab, 4th Generation: NONREACTIVE
Hepatitis B Surface Ag: NEGATIVE

## 2015-03-18 LAB — VITAMIN D 25 HYDROXY (VIT D DEFICIENCY, FRACTURES): VIT D 25 HYDROXY: 46 ng/mL (ref 30–100)

## 2015-03-18 LAB — FOLLICLE STIMULATING HORMONE: FSH: 160.5 m[IU]/mL — ABNORMAL HIGH

## 2015-03-18 LAB — HEPATITIS C ANTIBODY: HCV AB: NEGATIVE

## 2015-03-21 ENCOUNTER — Ambulatory Visit (HOSPITAL_COMMUNITY)
Admission: RE | Admit: 2015-03-21 | Discharge: 2015-03-21 | Disposition: A | Discharge status: Home / Self Care | Payer: BLUE CROSS/BLUE SHIELD | Source: Ambulatory Visit | Attending: Certified Nurse Midwife | Admitting: Certified Nurse Midwife

## 2015-03-21 ENCOUNTER — Other Ambulatory Visit: Payer: Self-pay | Admitting: Certified Nurse Midwife

## 2015-03-21 ENCOUNTER — Ambulatory Visit: Payer: BLUE CROSS/BLUE SHIELD | Admitting: Certified Nurse Midwife

## 2015-03-21 ENCOUNTER — Ambulatory Visit (HOSPITAL_COMMUNITY): Payer: BLUE CROSS/BLUE SHIELD

## 2015-03-21 DIAGNOSIS — Z1231 Encounter for screening mammogram for malignant neoplasm of breast: Secondary | ICD-10-CM | POA: Insufficient documentation

## 2015-03-21 LAB — IPS PAP TEST WITH REFLEX TO HPV

## 2015-03-25 NOTE — Progress Notes (Signed)
Encounter reviewed Larhonda Dettloff, MD   

## 2015-04-01 ENCOUNTER — Other Ambulatory Visit (INDEPENDENT_AMBULATORY_CARE_PROVIDER_SITE_OTHER): Payer: BLUE CROSS/BLUE SHIELD

## 2015-04-01 DIAGNOSIS — R899 Unspecified abnormal finding in specimens from other organs, systems and tissues: Secondary | ICD-10-CM

## 2015-04-01 LAB — HEPATIC FUNCTION PANEL
ALBUMIN: 4.2 g/dL (ref 3.6–5.1)
ALT: 19 U/L (ref 6–29)
AST: 23 U/L (ref 10–35)
Alkaline Phosphatase: 46 U/L (ref 33–130)
Bilirubin, Direct: 0.1 mg/dL (ref <=0.2)
Indirect Bilirubin: 0.4 mg/dL (ref 0.2–1.2)
TOTAL PROTEIN: 6.8 g/dL (ref 6.1–8.1)
Total Bilirubin: 0.5 mg/dL (ref 0.2–1.2)

## 2016-03-02 ENCOUNTER — Other Ambulatory Visit: Payer: Self-pay | Admitting: Certified Nurse Midwife

## 2016-03-02 DIAGNOSIS — Z1231 Encounter for screening mammogram for malignant neoplasm of breast: Secondary | ICD-10-CM

## 2016-03-20 ENCOUNTER — Ambulatory Visit (INDEPENDENT_AMBULATORY_CARE_PROVIDER_SITE_OTHER): Payer: BLUE CROSS/BLUE SHIELD | Admitting: Certified Nurse Midwife

## 2016-03-20 ENCOUNTER — Encounter: Payer: Self-pay | Admitting: Certified Nurse Midwife

## 2016-03-20 VITALS — BP 110/68 | HR 68 | Resp 16 | Ht 63.5 in | Wt 124.0 lb

## 2016-03-20 DIAGNOSIS — Z01419 Encounter for gynecological examination (general) (routine) without abnormal findings: Secondary | ICD-10-CM | POA: Diagnosis not present

## 2016-03-20 DIAGNOSIS — Z0000 Encounter for general adult medical examination without abnormal findings: Secondary | ICD-10-CM

## 2016-03-20 DIAGNOSIS — Z124 Encounter for screening for malignant neoplasm of cervix: Secondary | ICD-10-CM | POA: Diagnosis not present

## 2016-03-20 DIAGNOSIS — N912 Amenorrhea, unspecified: Secondary | ICD-10-CM | POA: Diagnosis not present

## 2016-03-20 LAB — COMPREHENSIVE METABOLIC PANEL
ALK PHOS: 48 U/L (ref 33–130)
ALT: 80 U/L — AB (ref 6–29)
AST: 72 U/L — ABNORMAL HIGH (ref 10–35)
Albumin: 4.3 g/dL (ref 3.6–5.1)
BUN: 23 mg/dL (ref 7–25)
CALCIUM: 9.8 mg/dL (ref 8.6–10.4)
CHLORIDE: 104 mmol/L (ref 98–110)
CO2: 27 mmol/L (ref 20–31)
Creat: 1.02 mg/dL (ref 0.50–1.05)
Glucose, Bld: 81 mg/dL (ref 65–99)
POTASSIUM: 4.3 mmol/L (ref 3.5–5.3)
Sodium: 140 mmol/L (ref 135–146)
TOTAL PROTEIN: 7 g/dL (ref 6.1–8.1)
Total Bilirubin: 0.6 mg/dL (ref 0.2–1.2)

## 2016-03-20 LAB — LIPID PANEL
CHOLESTEROL: 149 mg/dL (ref <200)
HDL: 57 mg/dL (ref >50)
LDL Cholesterol: 77 mg/dL (ref <100)
TRIGLYCERIDES: 74 mg/dL (ref <150)
Total CHOL/HDL Ratio: 2.6 Ratio (ref <5.0)
VLDL: 15 mg/dL (ref <30)

## 2016-03-20 LAB — TSH: TSH: 0.69 m[IU]/L

## 2016-03-20 LAB — CBC
HEMATOCRIT: 42.9 % (ref 35.0–45.0)
HEMOGLOBIN: 13.8 g/dL (ref 11.7–15.5)
MCH: 27.8 pg (ref 27.0–33.0)
MCHC: 32.2 g/dL (ref 32.0–36.0)
MCV: 86.5 fL (ref 80.0–100.0)
MPV: 10.2 fL (ref 7.5–12.5)
Platelets: 210 10*3/uL (ref 140–400)
RBC: 4.96 MIL/uL (ref 3.80–5.10)
RDW: 13.6 % (ref 11.0–15.0)
WBC: 8.8 10*3/uL (ref 3.8–10.8)

## 2016-03-20 NOTE — Patient Instructions (Signed)

## 2016-03-20 NOTE — Progress Notes (Signed)
51 y.o. [id number] Married  Caucasian Fe here for annual exam. Lost job last year, but working now in [employer]. No hot flashes or night sweats. Denies vaginal bleeding or vaginal dryness.  Had hairline fracture of left hip last year and now released from orthopedic. Back to exercising and doing boot camps, no issues with exercise now. Had a week of headache off and on ? Sinus. Took OTC sinus medication and resolved. Denies any aura, loss of vision or nausea or numbness with occurrence. Has not occurred since. Sees urgent care if needed. No other health issues today. Desires screening labs. Daughter getting married next week!  No LMP recorded. Patient has had an ablation.          Sexually active: Yes.    The current method of family planning is tubal ligation.    Exercising: Yes.    bootcamp, walking & running Smoker:  no  Health Maintenance: Pap:  03-15-14 neg HPV HR neg, colpo 2015 CIN1, 03-17-15 neg no endos MMG:  03-21-15 category b density birads 1:neg, scheduled 03/29/16 Colonoscopy:  12/16 normal f/u 6612yrs BMD:   none TDaP:  2009 Shingles: no Pneumonia: no Hep C and HIV: HIV & Hep c neg 2017 Labs:  Self breast exam: done monthly   reports that she has never smoked. She has never used smokeless tobacco. She reports that she does not drink alcohol or use drugs.  Past Medical History:  Diagnosis Date  . High risk HPV infection 10/13   neg pap, +HR HPV, 16/18 genotype neg  . LGSIL (low grade squamous intraepithelial dysplasia)    12/14 HPV HR not detected    Past Surgical History:  Procedure Laterality Date  . COLPOSCOPY    . HTA (ablation)  08/10/04  . REFRACTIVE SURGERY  2003  . TUBAL LIGATION     BTSP    Current Outpatient Prescriptions  Medication Sig Dispense Refill  . CALCIUM PO Take by mouth daily.    . Multiple Vitamins-Minerals (MULTIVITAMIN PO) Take by mouth daily.    . valACYclovir (VALTREX) 500 MG tablet Take 1 tablet (500 mg total) by mouth 2 (two) times daily. 30  tablet 12   No current facility-administered medications for this visit.     Family History  Problem Relation Age of Onset  . Aneurysm Mother   . Hypertension Mother   . Thyroid disease Mother   . Hypertension Sister   . Cancer Maternal Uncle     lung    ROS:  Pertinent items are noted in HPI.  Otherwise, a comprehensive ROS was negative.  Exam:   BP 110/68   Pulse 68   Resp 16   Ht 5' 3.5" (1.613 m)   Wt 124 lb (56.2 kg)   BMI 21.62 kg/m  Height: 5' 3.5" (161.3 cm) Ht Readings from Last 3 Encounters:  03/20/16 5' 3.5" (1.613 m)  03/17/15 5' 3.75" (1.619 m)  03/15/14 5' 3.75" (1.619 m)    General appearance: alert, cooperative and appears stated age Head: Normocephalic, without obvious abnormality, atraumatic Neck: no adenopathy, supple, symmetrical, trachea midline and thyroid normal to inspection and palpation Lungs: clear to auscultation bilaterally Breasts: normal appearance, no masses or tenderness, No nipple retraction or dimpling, No nipple discharge or bleeding, No axillary or supraclavicular adenopathy Heart: regular rate and rhythm Abdomen: soft, non-tender; no masses,  no organomegaly Extremities: extremities normal, atraumatic, no cyanosis or edema Skin: Skin color, texture, turgor normal. No rashes or lesions Lymph nodes: Cervical, supraclavicular, and  axillary nodes normal. No abnormal inguinal nodes palpated Neurologic: Grossly normal   Pelvic: External genitalia:  no lesions              Urethra:  normal appearing urethra with no masses, tenderness or lesions              Bartholin's and Skene's: normal                 Vagina: normal appearing vagina with normal color and discharge, no lesions              Cervix: anteverted, multiparous appearance, no cervical motion tenderness and no lesions              Pap taken: Yes.   patient request Bimanual Exam:  Uterus:  normal size, contour, position, consistency, mobility, non-tender              Adnexa:  normal adnexa and no mass, fullness, tenderness               Rectovaginal: Confirms               Anus:  normal sphincter tone, no lesions  Chaperone present: yes  A:  Well Woman with normal exam  Contraception tubal/ Menopausal  Recent hair line fracture of left hip due to excessive exercise, out of follow up with orthopedic now.  Sinus headache occurrence resolved  Screening labs  P:   Reviewed health and wellness pertinent to exam  Aware of need to evaluate if vaginal bleeding  Cautioned to not over of boot camp exercise, and consume adequate calcium and vitamin d for bone health.  Discussed warning signs with headache and need to advise or be seen if occurs.  Lab: CMP,Lipid panel, CBC,TSH, Vitamin D  Pap smear as above with HPV reflex   counseled on breast self exam, mammography screening, menopause, adequate intake of calcium and vitamin D, diet and exercise  return annually or prn  An After Visit Summary was printed and given to the patient.

## 2016-03-21 ENCOUNTER — Other Ambulatory Visit: Payer: Self-pay | Admitting: Certified Nurse Midwife

## 2016-03-21 DIAGNOSIS — R899 Unspecified abnormal finding in specimens from other organs, systems and tissues: Secondary | ICD-10-CM

## 2016-03-21 LAB — IPS PAP TEST WITH REFLEX TO HPV

## 2016-03-21 LAB — FOLLICLE STIMULATING HORMONE: FSH: 153.9 m[IU]/mL — ABNORMAL HIGH

## 2016-03-21 LAB — VITAMIN D 25 HYDROXY (VIT D DEFICIENCY, FRACTURES): Vit D, 25-Hydroxy: 55 ng/mL (ref 30–100)

## 2016-03-21 NOTE — Progress Notes (Signed)
Encounter reviewed Willmar Stockinger, MD   

## 2016-03-22 ENCOUNTER — Telehealth: Payer: Self-pay

## 2016-03-22 NOTE — Telephone Encounter (Signed)
-----   Message from Verner Choleborah S Leonard, CNM sent at 03/21/2016  4:30 PM EST ----- [facility] Vitamin D is normal range TSH is normal Lipid panel is normal CBC is normal Pap smear is negative 02 Liver kidney,glucose profile is all normal except for elevated ALT,AST Need recheck in 2 weeks, avoid any OTC supplement and OTC pain reliever until checked if at all possible Order placed, please schedule

## 2016-03-22 NOTE — Telephone Encounter (Signed)
Patient informed of lab results. Lab appointment scheduled on 04/06/16 @ 9am.

## 2016-03-22 NOTE — Telephone Encounter (Signed)
Left message for patient on vm (per DPR) for patient to call Nicole Carlson back.

## 2016-03-29 ENCOUNTER — Ambulatory Visit (HOSPITAL_COMMUNITY)
Admission: RE | Admit: 2016-03-29 | Discharge: 2016-03-29 | Disposition: A | Discharge status: Home / Self Care | Payer: BLUE CROSS/BLUE SHIELD | Source: Ambulatory Visit | Attending: Certified Nurse Midwife | Admitting: Certified Nurse Midwife

## 2016-03-29 DIAGNOSIS — Z1231 Encounter for screening mammogram for malignant neoplasm of breast: Secondary | ICD-10-CM | POA: Diagnosis not present

## 2016-04-04 ENCOUNTER — Telehealth: Payer: Self-pay | Admitting: Certified Nurse Midwife

## 2016-04-04 NOTE — Telephone Encounter (Signed)
agree

## 2016-04-04 NOTE — Telephone Encounter (Signed)
Spoke with patient. Patient states that she has been sick and started on Amoxicillin yesterday. Has taken 3 doses of Amoxicillin and 1 dose of Claritin yesterday. Is scheduled to return for recheck of CMP on 04/06/2016 and wants to ensure it is okay to proceed with the medication she has taken. Advised these medications should not interfere with her CMP check. Patient will keep appointment as scheduled. Advised I will review with Leota Sauerseborah Leonard CNM and if she has any additional recommendations or if lab appointment needs to be adjusted I will return call. Patient is agreeable.

## 2016-04-04 NOTE — Telephone Encounter (Signed)
Patient has a lab appointment 04/06/16 and was told not to take any otc medications for two weeks. Patient has been sick recently and took otc medications. Patient is asking if she will need to reschedule.

## 2016-04-06 ENCOUNTER — Other Ambulatory Visit (INDEPENDENT_AMBULATORY_CARE_PROVIDER_SITE_OTHER): Payer: BLUE CROSS/BLUE SHIELD

## 2016-04-06 DIAGNOSIS — R899 Unspecified abnormal finding in specimens from other organs, systems and tissues: Secondary | ICD-10-CM

## 2016-04-06 LAB — HEPATIC FUNCTION PANEL
ALBUMIN: 4.4 g/dL (ref 3.6–5.1)
ALT: 21 U/L (ref 6–29)
AST: 22 U/L (ref 10–35)
Alkaline Phosphatase: 47 U/L (ref 33–130)
BILIRUBIN DIRECT: 0.1 mg/dL (ref <=0.2)
Indirect Bilirubin: 0.4 mg/dL (ref 0.2–1.2)
Total Bilirubin: 0.5 mg/dL (ref 0.2–1.2)
Total Protein: 6.9 g/dL (ref 6.1–8.1)

## 2016-06-06 DIAGNOSIS — L821 Other seborrheic keratosis: Secondary | ICD-10-CM | POA: Diagnosis not present

## 2016-06-06 DIAGNOSIS — L814 Other melanin hyperpigmentation: Secondary | ICD-10-CM | POA: Diagnosis not present

## 2016-06-06 DIAGNOSIS — D225 Melanocytic nevi of trunk: Secondary | ICD-10-CM | POA: Diagnosis not present

## 2016-06-06 DIAGNOSIS — L570 Actinic keratosis: Secondary | ICD-10-CM | POA: Diagnosis not present

## 2016-08-20 ENCOUNTER — Other Ambulatory Visit: Payer: Self-pay | Admitting: Certified Nurse Midwife

## 2016-08-20 DIAGNOSIS — B009 Herpesviral infection, unspecified: Secondary | ICD-10-CM

## 2016-08-20 MED ORDER — VALACYCLOVIR HCL 500 MG PO TABS: ORAL_TABLET | ORAL | 2 refills | Status: DC

## 2016-08-20 NOTE — Telephone Encounter (Signed)
Can you please check how the patient is taking the valtrex. 30 tablets with 12 refills were called in. Is she taking it daily, or just with outbreaks, does she know there are refills on the script? If she is taking it BID we need to adjust the script. If she is taking it BID, please ask about frequency of outbreaks. I assume it's genital HSV, but I don't see any mention of it in the notes.

## 2016-08-20 NOTE — Telephone Encounter (Signed)
Patient is only taking as needed for fever blisters. She states that her refills at the pharmacy have expired. She has a fever blister now

## 2016-08-20 NOTE — Telephone Encounter (Signed)
[facility] [place] at [phone].

## 2016-08-20 NOTE — Telephone Encounter (Signed)
Medication refill request: Valtrex   Last AEX:  03-20-16  Next AEX: 03-21-17  Last MMG (if hormonal medication request): 03-29-16 WNL  Refill authorized: please advise  Sending to JJ since DL is out of the office

## 2016-08-20 NOTE — Telephone Encounter (Signed)
Phone call to patient. Clarified dose instructions for fever blister from Dr Oscar LaJertson. Patient confirms last time she filled medication was in February. She is only taking as needed for oral fever blister. Advised new prescription with two refills has been sent to pharmacy.

## 2016-08-23 DIAGNOSIS — R52 Pain, unspecified: Secondary | ICD-10-CM | POA: Diagnosis not present

## 2016-08-23 DIAGNOSIS — S63634A Sprain of interphalangeal joint of right ring finger, initial encounter: Secondary | ICD-10-CM | POA: Diagnosis not present

## 2016-09-07 DIAGNOSIS — S63634A Sprain of interphalangeal joint of right ring finger, initial encounter: Secondary | ICD-10-CM | POA: Diagnosis not present

## 2016-09-14 DIAGNOSIS — S63634A Sprain of interphalangeal joint of right ring finger, initial encounter: Secondary | ICD-10-CM | POA: Diagnosis not present

## 2016-11-05 DIAGNOSIS — Z23 Encounter for immunization: Secondary | ICD-10-CM | POA: Diagnosis not present

## 2017-03-12 ENCOUNTER — Other Ambulatory Visit: Payer: Self-pay | Admitting: Certified Nurse Midwife

## 2017-03-12 DIAGNOSIS — Z1231 Encounter for screening mammogram for malignant neoplasm of breast: Secondary | ICD-10-CM

## 2017-03-21 ENCOUNTER — Other Ambulatory Visit: Payer: Self-pay

## 2017-03-21 ENCOUNTER — Ambulatory Visit (INDEPENDENT_AMBULATORY_CARE_PROVIDER_SITE_OTHER): Payer: No Typology Code available for payment source | Admitting: Certified Nurse Midwife

## 2017-03-21 ENCOUNTER — Other Ambulatory Visit (HOSPITAL_COMMUNITY)
Admission: RE | Admit: 2017-03-21 | Discharge: 2017-03-21 | Disposition: A | Discharge status: Home / Self Care | Payer: No Typology Code available for payment source | Source: Ambulatory Visit | Attending: Certified Nurse Midwife | Admitting: Certified Nurse Midwife

## 2017-03-21 ENCOUNTER — Encounter: Payer: Self-pay | Admitting: Certified Nurse Midwife

## 2017-03-21 VITALS — BP 110/60 | HR 72 | Resp 16 | Ht 63.75 in | Wt 123.0 lb

## 2017-03-21 DIAGNOSIS — Z1151 Encounter for screening for human papillomavirus (HPV): Secondary | ICD-10-CM | POA: Insufficient documentation

## 2017-03-21 DIAGNOSIS — N951 Menopausal and female climacteric states: Secondary | ICD-10-CM

## 2017-03-21 DIAGNOSIS — B009 Herpesviral infection, unspecified: Secondary | ICD-10-CM

## 2017-03-21 DIAGNOSIS — Z23 Encounter for immunization: Secondary | ICD-10-CM | POA: Diagnosis not present

## 2017-03-21 DIAGNOSIS — Z0000 Encounter for general adult medical examination without abnormal findings: Secondary | ICD-10-CM | POA: Diagnosis not present

## 2017-03-21 DIAGNOSIS — Z01419 Encounter for gynecological examination (general) (routine) without abnormal findings: Secondary | ICD-10-CM | POA: Diagnosis not present

## 2017-03-21 DIAGNOSIS — R634 Abnormal weight loss: Secondary | ICD-10-CM | POA: Diagnosis not present

## 2017-03-21 DIAGNOSIS — Z124 Encounter for screening for malignant neoplasm of cervix: Secondary | ICD-10-CM | POA: Insufficient documentation

## 2017-03-21 DIAGNOSIS — R87610 Atypical squamous cells of undetermined significance on cytologic smear of cervix (ASC-US): Secondary | ICD-10-CM | POA: Insufficient documentation

## 2017-03-21 DIAGNOSIS — R6889 Other general symptoms and signs: Secondary | ICD-10-CM

## 2017-03-21 MED ORDER — VALACYCLOVIR HCL 500 MG PO TABS: ORAL_TABLET | ORAL | 12 refills | Status: DC

## 2017-03-21 NOTE — Progress Notes (Signed)
52 y.o. [id number] Married  Caucasian Fe here for annual exam. Periods none, sometimes feel like she may start, but no bleeding. Occsional HSV outbreaks, Valtrex working well, needs Rx update. Changed jobs and working with [employer], "so much better".  Dealing with constipation now, has increased fresh fruit and vegetables and water intake, seems better. No stool color or consistency change. Stepfather passed and still grieving, but doing "OK". Desires screening labs today. No other health issues. Planning 3 cruises this year!  No LMP recorded. Patient has had an ablation.          Sexually active: Yes.    The current method of family planning is tubal ligation.    Exercising: Yes.    weights, walk & run Smoker:  no  Health Maintenance: Pap:  03-20-16 neg History of Abnormal Pap: yes MMG:  03-29-16 category b density birads 1:neg, has scheduled Self Breast exams: yes Colonoscopy:  12/16 f/u 581yrs BMD:   none TDaP:  2009 due date Shingles: no Pneumonia: no Hep C and HIV: both neg 2017 Labs: yes   reports that  has never smoked. she has never used smokeless tobacco. She reports that she does not drink alcohol or use drugs.  Past Medical History:  Diagnosis Date  . High risk HPV infection 10/13   neg pap, +HR HPV, 16/18 genotype neg  . LGSIL (low grade squamous intraepithelial dysplasia)    12/14 HPV HR not detected  . Stress fracture     Past Surgical History:  Procedure Laterality Date  . COLPOSCOPY    . HTA (ablation)  08/10/04  . REFRACTIVE SURGERY  2003  . TUBAL LIGATION     BTSP    Current Outpatient Medications  Medication Sig Dispense Refill  . Biotin w/ Vitamins C & E (HAIR/SKIN/NAILS PO) Take by mouth.    Marland Kitchen. CALCIUM PO Take by mouth daily.    . Multiple Vitamins-Minerals (MULTIVITAMIN PO) Take by mouth daily.    . Probiotic Product (PROBIOTIC PO) Take by mouth.    . valACYclovir (VALTREX) 500 MG tablet 4 tablets po q 12 hours x 2 doses 30 tablet 2   No current  facility-administered medications for this visit.     Family History  Problem Relation Age of Onset  . Aneurysm Mother   . Hypertension Mother   . Thyroid disease Mother   . Hypertension Sister   . Cancer Maternal Uncle        lung    ROS:  Pertinent items are noted in HPI.  Otherwise, a comprehensive ROS was negative.  Exam:   BP 110/60   Pulse 72   Resp 16   Ht 5' 3.75" (1.619 m)   Wt 123 lb (55.8 kg)   BMI 21.28 kg/m  Height: 5' 3.75" (161.9 cm) Ht Readings from Last 3 Encounters:  03/21/17 5' 3.75" (1.619 m)  03/20/16 5' 3.5" (1.613 m)  03/17/15 5' 3.75" (1.619 m)    General appearance: alert, cooperative and appears stated age Head: Normocephalic, without obvious abnormality, atraumatic Neck: no adenopathy, supple, symmetrical, trachea midline and thyroid normal to inspection and palpation Lungs: clear to auscultation bilaterally Breasts: normal appearance, no masses or tenderness, No nipple retraction or dimpling, No nipple discharge or bleeding, No axillary or supraclavicular adenopathy Heart: regular rate and rhythm Abdomen: soft, non-tender; no masses,  no organomegaly Extremities: extremities normal, atraumatic, no cyanosis or edema Skin: Skin color, texture, turgor normal. No rashes or lesions Lymph nodes: Cervical, supraclavicular, and axillary  nodes normal. No abnormal inguinal nodes palpated Neurologic: Grossly normal   Pelvic: External genitalia:  no lesions              Urethra:  normal appearing urethra with no masses, tenderness or lesions              Bartholin's and Skene's: normal                 Vagina: normal appearing vagina with normal color and discharge, no lesions              Cervix: multiparous appearance, no cervical motion tenderness and no lesions              Pap taken: Yes.   Bimanual Exam:  Uterus:  normal size, contour, position, consistency, mobility, non-tender              Adnexa: normal adnexa and no mass, fullness,  tenderness               Rectovaginal: Confirms               Anus:  normal sphincter tone, no lesions  Chaperone present: yes  A:  Well Woman with normal exam  Menopausal no HRT  Constipation working with diet  History of oral HSV,Valtrex working well  Screening labs  P:   Reviewed health and wellness pertinent to exam  Aware if vaginal bleeding needs to advise  Discussed OTC Metamucil use and see if this helps, increase roughage in diet, dried prunes. Will advise if not change. Warning signs given for constipation.  Rx Valtrex see order with instructions  Labs: TSH, Lipid panel, CMP, CBC, Vitamin D  Pap smear: yes   counseled on breast self exam, mammography screening, feminine hygiene, adequate intake of calcium and vitamin D, diet and exercise  return annually or prn  An After Visit Summary was printed and given to the patient.

## 2017-03-21 NOTE — Patient Instructions (Signed)

## 2017-03-22 LAB — COMPREHENSIVE METABOLIC PANEL
A/G RATIO: 1.8 (ref 1.2–2.2)
ALBUMIN: 4.6 g/dL (ref 3.5–5.5)
ALT: 29 IU/L (ref 0–32)
AST: 27 IU/L (ref 0–40)
Alkaline Phosphatase: 56 IU/L (ref 39–117)
BILIRUBIN TOTAL: 0.4 mg/dL (ref 0.0–1.2)
BUN / CREAT RATIO: 21 (ref 9–23)
BUN: 20 mg/dL (ref 6–24)
CHLORIDE: 104 mmol/L (ref 96–106)
CO2: 24 mmol/L (ref 20–29)
Calcium: 9.8 mg/dL (ref 8.7–10.2)
Creatinine, Ser: 0.97 mg/dL (ref 0.57–1.00)
GFR calc non Af Amer: 67 mL/min/{1.73_m2} (ref >59)
GFR, EST AFRICAN AMERICAN: 77 mL/min/{1.73_m2} (ref >59)
GLOBULIN, TOTAL: 2.5 g/dL (ref 1.5–4.5)
Glucose: 84 mg/dL (ref 65–99)
POTASSIUM: 4.8 mmol/L (ref 3.5–5.2)
SODIUM: 145 mmol/L — AB (ref 134–144)
Total Protein: 7.1 g/dL (ref 6.0–8.5)

## 2017-03-22 LAB — LIPID PANEL
CHOL/HDL RATIO: 2.9 ratio (ref 0.0–4.4)
CHOLESTEROL TOTAL: 168 mg/dL (ref 100–199)
HDL: 58 mg/dL (ref >39)
LDL Calculated: 91 mg/dL (ref 0–99)
TRIGLYCERIDES: 97 mg/dL (ref 0–149)
VLDL Cholesterol Cal: 19 mg/dL (ref 5–40)

## 2017-03-22 LAB — CBC
Hematocrit: 44.4 % (ref 34.0–46.6)
Hemoglobin: 14.2 g/dL (ref 11.1–15.9)
MCH: 27.8 pg (ref 26.6–33.0)
MCHC: 32 g/dL (ref 31.5–35.7)
MCV: 87 fL (ref 79–97)
PLATELETS: 233 10*3/uL (ref 150–379)
RBC: 5.11 x10E6/uL (ref 3.77–5.28)
RDW: 13 % (ref 12.3–15.4)
WBC: 6.3 10*3/uL (ref 3.4–10.8)

## 2017-03-22 LAB — VITAMIN D 25 HYDROXY (VIT D DEFICIENCY, FRACTURES): Vit D, 25-Hydroxy: 51.9 ng/mL (ref 30.0–100.0)

## 2017-03-22 LAB — TSH: TSH: 0.605 u[IU]/mL (ref 0.450–4.500)

## 2017-03-25 LAB — CYTOLOGY - PAP
Diagnosis: UNDETERMINED — AB
HPV: NOT DETECTED

## 2017-04-02 ENCOUNTER — Ambulatory Visit
Admission: RE | Admit: 2017-04-02 | Discharge: 2017-04-02 | Disposition: A | Discharge status: Home / Self Care | Payer: BLUE CROSS/BLUE SHIELD | Source: Ambulatory Visit | Attending: Certified Nurse Midwife | Admitting: Certified Nurse Midwife

## 2017-04-02 DIAGNOSIS — Z1231 Encounter for screening mammogram for malignant neoplasm of breast: Secondary | ICD-10-CM

## 2017-05-06 ENCOUNTER — Other Ambulatory Visit: Payer: Self-pay | Admitting: Orthopedic Surgery

## 2017-05-06 ENCOUNTER — Ambulatory Visit
Admission: RE | Admit: 2017-05-06 | Discharge: 2017-05-06 | Disposition: A | Discharge status: Home / Self Care | Payer: No Typology Code available for payment source | Source: Ambulatory Visit | Attending: Orthopedic Surgery | Admitting: Orthopedic Surgery

## 2017-05-06 DIAGNOSIS — S82841A Displaced bimalleolar fracture of right lower leg, initial encounter for closed fracture: Secondary | ICD-10-CM | POA: Insufficient documentation

## 2017-07-24 ENCOUNTER — Ambulatory Visit (INDEPENDENT_AMBULATORY_CARE_PROVIDER_SITE_OTHER): Payer: No Typology Code available for payment source | Admitting: Rehabilitative and Restorative Service Providers"

## 2017-07-24 ENCOUNTER — Encounter: Payer: Self-pay | Admitting: Rehabilitative and Restorative Service Providers"

## 2017-07-24 DIAGNOSIS — M25672 Stiffness of left ankle, not elsewhere classified: Secondary | ICD-10-CM | POA: Diagnosis not present

## 2017-07-24 DIAGNOSIS — M25572 Pain in left ankle and joints of left foot: Secondary | ICD-10-CM | POA: Diagnosis not present

## 2017-07-24 DIAGNOSIS — R2689 Other abnormalities of gait and mobility: Secondary | ICD-10-CM

## 2017-07-24 DIAGNOSIS — M6281 Muscle weakness (generalized): Secondary | ICD-10-CM | POA: Diagnosis not present

## 2017-07-24 DIAGNOSIS — M25571 Pain in right ankle and joints of right foot: Secondary | ICD-10-CM

## 2017-07-24 NOTE — Patient Instructions (Addendum)
HIP: Hamstrings - Supine  Place strap around foot. Raise leg up, keeping knee straight.  Bend opposite knee to protect back if indicated. Hold 30 seconds. 3 reps per set, 2-3 sets per day  Outer Hip Stretch: Reclined IT Band Stretch (Strap)   Strap around one foot, pull leg across body until you feel a pull or stretch in the outside of your hip, with shoulders on mat. Hold for 30 seconds. Repeat 3 times each leg. 2-3 times/day.  Piriformis Stretch   Lying on back, pull right knee toward opposite shoulder. Hold 30 seconds. Repeat 3 times. Do 2-3 sessions per day.   Quads / HF, Supine   Lie near edge of bed, pull both knees up toward chest. Hold one knee as you drop the other leg off the edge of the bed.  Relax hanging knee/can bend knee back if indicated. Hold 30 seconds. Repeat 3 times per session. Do 2-3 sessions per day.     Gastroc, Sitting (Passive)    Sit with strap or towel around ball of foot. Gently pull toward body. Hold __30_ seconds.  Repeat __2_ times per session. Do _2-3__ sessions per day.     Toe Curl: Bilateral    With both feet resting on towel, slowly bunch up towel by curling toes. Hold __30-60__ seconds. Repeat _2___ times per set. Do __2__ sessions per day.    PROM: Ankle Inversion / Eversion    Gently grasp left foot and bend ankle by moving foot in and out. Hold each position ___20-30_ seconds. Repeat __3__ times per set.  Do __2-3__ sessions per day. Have someone else move foot.   Work on standing with weight equal on both legs - feet straight ahead

## 2017-07-24 NOTE — Therapy (Addendum)
[facility] [place] [address] [place], [place], [address] Phone: [phone]   Fax:  [phone]  Physical Therapy Evaluation  Patient Details  Name: Nicole Carlson MRN: [id number] Date of Birth: Oct 08, 1965 Referring Provider: Dr Toni Arthurs   Encounter Date: 07/24/2017  PT End of Session - 07/24/17 0802    Visit Number  1    Number of Visits  12    Date for PT Re-Evaluation  09/04/17    PT Start Time  0800    PT Stop Time  0859    PT Time Calculation (min)  59 min       Past Medical History:  Diagnosis Date  . High risk HPV infection 10/13   neg pap, +HR HPV, 16/18 genotype neg  . LGSIL (low grade squamous intraepithelial dysplasia)    12/14 HPV HR not detected  . Stress fracture     Past Surgical History:  Procedure Laterality Date  . COLPOSCOPY    . HTA (ablation)  08/10/04  . REFRACTIVE SURGERY  2003  . TUBAL LIGATION     BTSP    There were no vitals filed for this visit.   Subjective Assessment - 07/24/17 0811    Subjective  Fell 05/03/17 sustaining fx Rt ankle. ORIF 05/14/17. In cast then walking boot whichwas d/c'ed 07/19/17. continues to have lace up ankle support.     Pertinent History  stress fracture Rt femur ~ 3 yrs ago; denies any other medical or musculoskeletal problems     Diagnostic tests  xrays     Patient Stated Goals  walk comfortably get ready for trip to [place] the first of August     Currently in Pain?  Yes    Pain Score  2     Pain Location  Ankle    Pain Orientation  Right    Pain Descriptors / Indicators  Discomfort;Tightness    Pain Type  Acute pain;Surgical pain    Pain Radiating Towards  some pain in the heel     Pain Onset  More than a month ago    Pain Frequency  Intermittent    Aggravating Factors   weight bearing    Pain Relieving Factors  ice; elevation; meds          [facility] PT Assessment - 07/24/17 0001      Assessment   Medical Diagnosis  Bimalolar fx Rt ankle     Referring Provider  Dr Toni Arthurs    Onset Date/Surgical Date  05/03/17    Hand Dominance  Right    Next MD Visit  08/19/17    Prior Therapy  no       Precautions   Precautions  None      Restrictions   Weight Bearing Restrictions  No      Balance Screen   Has the patient fallen in the past 6 months  Yes    How many times?  1    Has the patient had a decrease in activity level because of a fear of falling?   No    Is the patient reluctant to leave their home because of a fear of falling?   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     Leisure  household chores; lake; exercise daily walking and working with a Psychologist, educationaltrainer - has started 2 days/wk       Observation/Other Assessments   Focus on Therapeutic Outcomes (FOTO)   54% limitation       Figure 8 Edema   Figure 8 - Right   45.5 cm    Figure 8 - Left   43.3 cm      Sensation   Additional Comments  WFL's       AROM   Right/Left Hip  -- WFL's bilat hips     Right/Left Knee  -- WFL's bilat knees     Right Ankle Dorsiflexion  1    Right Ankle Plantar Flexion  34    Right Ankle Inversion  21    Right Ankle Eversion  18    Left Ankle Dorsiflexion  14    Left Ankle Plantar Flexion  59    Left Ankle Inversion  47    Left Ankle Eversion  30      Strength   Overall Strength Comments  5/5 bilat hips and knees     Left Ankle Dorsiflexion  5/5    Left Ankle Plantar Flexion  5/5    Left Ankle Inversion  5/5    Left Ankle Eversion  5/5      Flexibility   Hamstrings  tight Lt > Rt     Quadriceps  tight bilat     ITB  tight Lt > Rt     Piriformis  tight Rt > Lt       Palpation   Palpation comment  palpable tenderness Rt lateral and medial ankle - screws palpable laterally        Ambulation/Gait   Ambulation Distance (Feet)  40 Feet    Gait Pattern  Decreased step length - right;Decreased hip/knee flexion - right;Decreased dorsiflexion - right;Decreased weight shift to right;Wide base of support Rt LE in ER     Ambulation Surface  Level    Gait velocity  slowed     Gait Comments  ambulates with Rt LE in ER                 Objective measurements completed on examination: See above findings.     Treatment consisted of exercise; exercise instruction for HEP(see exercises below); gait training; and vasopneumatic Rt ankle x 15 min          PT Education - 07/24/17 1142    Education Details  HEP    Person(s) Educated  Patient    Methods  Explanation;Demonstration;Tactile cues;Verbal cues;Handout    Comprehension  Verbalized understanding;Returned demonstration;Verbal cues required;Tactile cues required          PT Long Term Goals - 07/24/17 1149      PT LONG TERM GOAL #1   Title  Increase ROM Rt ankle by 5-8 degrees in all planes 09/04/17    Time  6    Period  Weeks    Status  New      PT LONG TERM GOAL #2   Title  Improve gait pattern with patient to demonstrate normal gait pattern for functional distances on level surfaces and ascend/descend steps safely and without difficulty 09/04/17    Time  6    Period  Weeks    Status  New      PT LONG TERM GOAL #3   Title  Patient reports confidence in travel and vacation plans for trip in August 09/04/17    Time  6    Period  Weeks    Status  New      PT LONG TERM GOAL #4   Title  Independent in HEP 09/04/17    Time  6    Period  Weeks    Status  New      PT LONG TERM GOAL #5   Title  Improve FOTO to </= 33% limitation 09/04/17    Time  6    Period  Weeks    Status  New             Plan - 07/24/17 1142    Clinical Impression Statement  Nicole Carlson presents s/p bimallolar fracture Rt ankle 05/03/17 with ORIF 05/14/17. Patient was weaned for walking boot 07/19/17. She has an AFO and  compression sock for the Rt ankle which she uses alternately. Patient has limited ROM/mobility LE's; decreased Rt ankle strength; decreased wt bearing tolerance Rt LE; abnormal gait pattern; pain. Patient will benefit from PT to address problems identified.     History and Personal Factors relevant to plan of care:  stress fracture Rt femur ~ 3 yrs ago from running     Clinical Presentation  Evolving    Clinical Decision Making  Low    Rehab Potential  Good    PT Frequency  2x / week    PT Duration  6 weeks    PT Treatment/Interventions  Patient/family education;ADLs/Self Care Home Management;Cryotherapy;Electrical Stimulation;Iontophoresis 4mg /ml Dexamethasone;Moist Heat;Ultrasound;Dry needling;Manual techniques;Neuromuscular re-education;Gait training;Stair training;Functional mobility training;Therapeutic activities;Therapeutic exercise;Balance training    PT Next Visit Plan  review HEP; progress with stretching and strengthening as indicated; joint mobs as indicated; manual work/passive stretch Rt ankle; balance and gait training; modalities as indicated     Consulted and Agree with Plan of Care  Patient       Patient will benefit from skilled therapeutic intervention in order to improve the following deficits and impairments:  Postural dysfunction, Improper body mechanics, Pain, Increased fascial restricitons, Increased muscle spasms, Decreased strength, Decreased range of motion, Decreased mobility, Decreased activity tolerance, Abnormal gait, Decreased balance  Visit Diagnosis: Pain in left ankle and joints of left foot - Plan: PT plan of care cert/re-cert  Stiffness of left ankle, not elsewhere classified - Plan: PT plan of care cert/re-cert  Muscle weakness (generalized) - Plan: PT plan of care cert/re-cert  Other abnormalities of gait and mobility - Plan: PT plan of care cert/re-cert     Problem List Patient Active Problem List   Diagnosis Date Noted  . METATARSALGIA  07/19/2008  . HAMMER TOE, ACQUIRED 07/19/2008  . ANKLE SPRAIN, RIGHT 07/19/2008  . KNEE PAIN, LEFT 06/02/2008  . ILIOTIBIAL BAND SYNDROME 06/02/2008    Nicole Carlson PT, MPH  07/24/2017, 11:55 AM  [facility] [address] [place], [place], [address] Phone: [phone]   Fax:  [phone]  Name: Nicole Carlson MRN: [id number] Date of Birth: July 05, 1965

## 2017-07-26 ENCOUNTER — Encounter: Payer: Self-pay | Admitting: Rehabilitative and Restorative Service Providers"

## 2017-07-26 ENCOUNTER — Ambulatory Visit (INDEPENDENT_AMBULATORY_CARE_PROVIDER_SITE_OTHER): Payer: No Typology Code available for payment source | Admitting: Rehabilitative and Restorative Service Providers"

## 2017-07-26 DIAGNOSIS — M6281 Muscle weakness (generalized): Secondary | ICD-10-CM | POA: Diagnosis not present

## 2017-07-26 DIAGNOSIS — R2689 Other abnormalities of gait and mobility: Secondary | ICD-10-CM | POA: Diagnosis not present

## 2017-07-26 DIAGNOSIS — M25571 Pain in right ankle and joints of right foot: Secondary | ICD-10-CM

## 2017-07-26 DIAGNOSIS — M25672 Stiffness of left ankle, not elsewhere classified: Secondary | ICD-10-CM | POA: Diagnosis not present

## 2017-07-26 NOTE — Therapy (Addendum)
[facility] [address] [place], [place], [address] Phone: [phone]   Fax:  [phone]  Physical Therapy Treatment  Patient Details  Name: Nicole Carlson MRN: [id number] Date of Birth: 04/06/1965 Referring Provider: Dr Toni ArthursJohn Hewitt   Encounter Date: 07/26/2017  PT End of Session - 07/26/17 0807    Visit Number  2    Number of Visits  12    Date for PT Re-Evaluation  09/04/17    PT Start Time  0803    PT Stop Time  0900    PT Time Calculation (min)  57 min    Activity Tolerance  Patient tolerated treatment well       Past Medical History:  Diagnosis Date  . High risk HPV infection 10/13   neg pap, +HR HPV, 16/18 genotype neg  . LGSIL (low grade squamous intraepithelial dysplasia)    12/14 HPV HR not detected  . Stress fracture     Past Surgical History:  Procedure Laterality Date  . COLPOSCOPY    . HTA (ablation)  08/10/04  . REFRACTIVE SURGERY  2003  . TUBAL LIGATION     BTSP    There were no vitals filed for this visit.  Subjective Assessment - 07/26/17 0808    Subjective  Soreness the evening of therapy. Patient is using ice to decrease soreness and pain. Also sore and hurting yesterday after work. Doing her exercises at home.     Currently in Pain?  Yes    Pain Score  5     Pain Location  Ankle    Pain Orientation  Right    Pain Descriptors / Indicators  Discomfort;Tightness    Pain Type  Acute pain;Surgical pain                       [facility] Adult PT Treatment/Exercise - 07/26/17 0001      Knee/Hip Exercises: Stretches   Passive Hamstring Stretch  Right;Left;2 reps;30 seconds supine with strap     Hip Flexor Stretch  Right;Left;2 reps;30 seconds sitting     ITB Stretch  Right;2 reps;30 seconds supine with strap     Piriformis Stretch  Right;3 reps;30 seconds supine travell varying angles       Manual Therapy   Manual therapy comments  pt sitting    Joint Mobilization   gentle mobs through the calcaneous and talus; tarsals; metatarsals; stretching through toes     Passive ROM  gentle PROM Rt ankle DF/PF/INV/EVE 2-3 reps each LE       Ankle Exercises: Stretches   Gastroc Stretch  3 reps;30 seconds sitting with towel       Ankle Exercises: Aerobic   Nustep  L4 x 5 min for ROM - arms 10       Ankle Exercises: Standing   Other Standing Ankle Exercises  standing with weight even shifting weight side to side; shallow knee bends with weight equal; weight shift forward and back stagger step ~6 inches       Ankle Exercises: Seated   Towel Crunch  5 reps      Ankle Exercises: Supine   T-Band  4 directions yellow TB x 10 reps DF/PF/INV/EVE        treatment concluded with vaso x 15 min Rt ankle to control edema and pain       PT Education - 07/26/17 0848    Education Details  HEP     Person(s)  Educated  Patient    Methods  Explanation;Demonstration;Tactile cues;Verbal cues;Handout    Comprehension  Verbalized understanding;Returned demonstration;Verbal cues required;Tactile cues required          PT Long Term Goals - 07/24/17 1149      PT LONG TERM GOAL #1   Title  Increase ROM Rt ankle by 5-8 degrees in all planes 09/04/17    Time  6    Period  Weeks    Status  New      PT LONG TERM GOAL #2   Title  Improve gait pattern with patient to demonstrate normal gait pattern for functional distances on level surfaces and ascend/descend steps safely and without difficulty 09/04/17    Time  6    Period  Weeks    Status  New      PT LONG TERM GOAL #3   Title  Patient reports confidence in travel and vacation plans for trip in August 09/04/17    Time  6    Period  Weeks    Status  New      PT LONG TERM GOAL #4   Title  Independent in HEP 09/04/17    Time  6    Period  Weeks    Status  New      PT LONG TERM GOAL #5   Title  Improve FOTO to </= 33% limitation 09/04/17    Time  6    Period  Weeks    Status  New            Plan - 07/26/17 1045     Clinical Impression Statement  Nicole Carlson reports some soreness which she treated with ice. Patient reviewed HEP and added exercises without difficulty. Treatment concluded with manual work/stretching and vaso.     Rehab Potential  Good    PT Frequency  2x / week    PT Duration  6 weeks    PT Treatment/Interventions  Patient/family education;ADLs/Self Care Home Management;Cryotherapy;Electrical Stimulation;Iontophoresis 4mg /ml Dexamethasone;Moist Heat;Ultrasound;Dry needling;Manual techniques;Neuromuscular re-education;Gait training;Stair training;Functional mobility training;Therapeutic activities;Therapeutic exercise;Balance training    Consulted and Agree with Plan of Care  Patient       Patient will benefit from skilled therapeutic intervention in order to improve the following deficits and impairments:  Postural dysfunction, Improper body mechanics, Pain, Increased fascial restricitons, Increased muscle spasms, Decreased strength, Decreased range of motion, Decreased mobility, Decreased activity tolerance, Abnormal gait, Decreased balance  Visit Diagnosis: Pain in right ankle and joints of right foot  Stiffness of left ankle, not elsewhere classified  Muscle weakness (generalized)  Other abnormalities of gait and mobility     Problem List Patient Active Problem List   Diagnosis Date Noted  . METATARSALGIA 07/19/2008  . HAMMER TOE, ACQUIRED 07/19/2008  . ANKLE SPRAIN, RIGHT 07/19/2008  . KNEE PAIN, LEFT 06/02/2008  . ILIOTIBIAL BAND SYNDROME 06/02/2008    Nicole Carlson [name] PT, MPH  07/26/2017, 10:52 AM  [facility] [address] [place], [place], [address] Phone: [phone]   Fax:  [phone]  Name: Nicole Carlson MRN: [id number] Date of Birth: 12-15-1965

## 2017-07-26 NOTE — Patient Instructions (Addendum)
Standing weight equal on both sides  - shift weight slowly side to side Stand with one foot forward about 6 inches - shift weight slowly forward and back  Stand with feet even and bend knees slightly      Dorsiflexion: Resisted    Facing anchor, tubing around left foot, pull toward face.  Repeat __10__ times per set. Do __1-3__ sets per session. Do __1__ sessions per day.     Plantar Flexion: Resisted    Anchor behind, tubing around left foot, press down. Repeat ____ times per set. Do ____ sets per session. Do ____ sessions per day.    Inversion: Resisted    Cross legs with right leg underneath, foot in tubing loop. Hold tubing around other foot to resist and turn foot in. Repeat ____ times per set. Do ____ sets per session. Do ____ sessions per day.    Eversion: Resisted    With right foot in tubing loop, hold tubing around other foot to resist and turn foot out. Repeat ____ times per set. Do ____ sets per session. Do ____ sessions per day.

## 2017-07-30 ENCOUNTER — Ambulatory Visit (INDEPENDENT_AMBULATORY_CARE_PROVIDER_SITE_OTHER): Payer: No Typology Code available for payment source | Admitting: Rehabilitative and Restorative Service Providers"

## 2017-07-30 ENCOUNTER — Encounter: Payer: Self-pay | Admitting: Rehabilitative and Restorative Service Providers"

## 2017-07-30 DIAGNOSIS — R2689 Other abnormalities of gait and mobility: Secondary | ICD-10-CM | POA: Diagnosis not present

## 2017-07-30 DIAGNOSIS — M6281 Muscle weakness (generalized): Secondary | ICD-10-CM

## 2017-07-30 DIAGNOSIS — M25571 Pain in right ankle and joints of right foot: Secondary | ICD-10-CM

## 2017-07-30 DIAGNOSIS — M25672 Stiffness of left ankle, not elsewhere classified: Secondary | ICD-10-CM | POA: Diagnosis not present

## 2017-07-30 NOTE — Therapy (Signed)
[facility] [place] [address] [place], [place], [address] Phone: [phone]   Fax:  [phone]  Physical Therapy Treatment  Patient Details  Name: Nicole Carlson MRN: [id number] Date of Birth: [birth year] Referring Provider: Dr Toni Arthurs   Encounter Date: 07/30/2017  PT End of Session - 07/30/17 0803    Visit Number  3    Number of Visits  12    Date for PT Re-Evaluation  09/04/17    PT Start Time  0800    PT Stop Time  0858    PT Time Calculation (min)  58 min    Activity Tolerance  Patient tolerated treatment well       Past Medical History:  Diagnosis Date  . High risk HPV infection 10/13   neg pap, +HR HPV, 16/18 genotype neg  . LGSIL (low grade squamous intraepithelial dysplasia)    12/14 HPV HR not detected  . Stress fracture     Past Surgical History:  Procedure Laterality Date  . COLPOSCOPY    . HTA (ablation)  08/10/04  . REFRACTIVE SURGERY  2003  . TUBAL LIGATION     BTSP    There were no vitals filed for this visit.  Subjective Assessment - 07/30/17 0804    Subjective  some continued soreness and aching. Using ice for the soreness. Working on exercises at home.     Currently in Pain?  Yes    Pain Score  2     Pain Location  Ankle    Pain Orientation  Right    Pain Descriptors / Indicators  Discomfort;Tightness    Pain Type  Acute pain;Surgical pain    Pain Onset  More than a month ago    Pain Frequency  Intermittent         [facility] PT Assessment - 07/30/17 0001      Assessment   Medical Diagnosis  Bimalolar fx Rt ankle     Referring Provider  Dr Toni Arthurs    Onset Date/Surgical Date  05/03/17    Hand Dominance  Right    Next MD Visit  08/19/17    Prior Therapy  no       AROM   Right Ankle Dorsiflexion  5      Palpation   Palpation comment  palpable tenderness Rt lateral and medial ankle - screws palpable laterally       Ambulation/Gait   Gait Comments  working on gait pattern  with more equal stride length; gait speed and smooth gait pattern 80 ft x 3                    [facility] Adult PT Treatment/Exercise - 07/30/17 0001      Knee/Hip Exercises: Stretches   Gastroc Stretch  Right;Left;3 reps;30 seconds    Soleus Stretch  Right;Left;3 reps;30 seconds      Vasopneumatic   Number Minutes Vasopneumatic   15 minutes for edema     Vasopnuematic Location   Ankle Rt    Vasopneumatic Pressure  Low    Vasopneumatic Temperature   34 deg      Manual Therapy   Manual therapy comments  pt supine     Joint Mobilization  gentle mobs through the calcaneous and talus; tarsals; metatarsals; stretching through toes     Passive ROM  gentle PROM Rt ankle DF/PF/INV/EVE 2-3 reps each LE       Ankle Exercises: Aerobic   Nustep  L5 x 5 min for ROM - arms 10       Ankle Exercises: Standing   SLS  20 sec x 4 each LE - HHA as needed for balance    Other Standing Ankle Exercises  standing with weight even shifting weight side to side; shallow knee bends with weight equal; weight shift forward and back stagger step ~6 inches; heel to toe and backwards walking  ~ 10 ft x 5 each      Other Standing Ankle Exercises  shallow knee bend 5 sec hold x 10 keeping heel down and knees straight      Ankle Exercises: Seated   Toe Raise  20 reps;1 second alternating toe patting              PT Education - 07/30/17 0859    Education Details  HEP     Person(s) Educated  Patient    Methods  Explanation;Demonstration;Tactile cues;Verbal cues;Handout    Comprehension  Verbalized understanding;Returned demonstration;Verbal cues required;Tactile cues required          PT Long Term Goals - 07/30/17 0852      PT LONG TERM GOAL #1   Title  Increase ROM Rt ankle by 5-8 degrees in all planes 09/04/17    Time  6    Period  Weeks    Status  On-going      PT LONG TERM GOAL #2   Title  Improve gait pattern with patient to demonstrate normal gait pattern for functional distances on  level surfaces and ascend/descend steps safely and without difficulty 09/04/17    Time  6    Period  Weeks    Status  On-going      PT LONG TERM GOAL #3   Title  Patient reports confidence in travel and vacation plans for trip in August 09/04/17    Time  6    Period  Weeks    Status  On-going      PT LONG TERM GOAL #4   Title  Independent in HEP 09/04/17    Time  6    Period  Weeks    Status  On-going      PT LONG TERM GOAL #5   Title  Improve FOTO to </= 33% limitation 09/04/17    Time  6    Period  Weeks    Status  On-going            Plan - 07/30/17 [id number]    Clinical Impression Statement  Continued progress with increasing ROM and decreased pain/stiffness. Patient tolerates all exercises well. Progressing well toward stated goals of therapy.     Rehab Potential  Good    PT Frequency  2x / week    PT Treatment/Interventions  Patient/family education;ADLs/Self Care Home Management;Cryotherapy;Electrical Stimulation;Iontophoresis 4mg /ml Dexamethasone;Moist Heat;Ultrasound;Dry needling;Manual techniques;Neuromuscular re-education;Gait training;Stair training;Functional mobility training;Therapeutic activities;Therapeutic exercise;Balance training    PT Next Visit Plan  review HEP; progress with stretching and strengthening as indicated; joint mobs as indicated; manual work/passive stretch Rt ankle; balance and gait training; modalities as indicated     Consulted and Agree with Plan of Care  Patient       Patient will benefit from skilled therapeutic intervention in order to improve the following deficits and impairments:  Postural dysfunction, Improper body mechanics, Pain, Increased fascial restricitons, Increased muscle spasms, Decreased strength, Decreased range of motion, Decreased mobility, Decreased activity tolerance, Abnormal gait, Decreased balance  Visit Diagnosis: Pain in right ankle and joints  of right foot  Stiffness of left ankle, not elsewhere classified  Muscle  weakness (generalized)  Other abnormalities of gait and mobility     Problem List Patient Active Problem List   Diagnosis Date Noted  . METATARSALGIA 07/19/2008  . HAMMER TOE, ACQUIRED 07/19/2008  . ANKLE SPRAIN, RIGHT 07/19/2008  . KNEE PAIN, LEFT 06/02/2008  . ILIOTIBIAL BAND SYNDROME 06/02/2008    Kambra Beachem [name] PT, MPH  07/30/2017, 9:00 AM  [facility] [address] [place], [place], [address] Phone: [phone]   Fax:  [phone]  Name: JALAINE RIGGENBACH MRN: [id number] Date of Birth: 1965-12-29

## 2017-07-30 NOTE — Patient Instructions (Addendum)
Achilles / Gastroc, Standing    Stand, right foot behind, heel on floor and turned slightly out, leg straight, forward leg bent. Move hips forward. Hold _30__ seconds. Repeat _3__ times per session. Do __2-3_ sessions per day.   Achilles / Soleus, Standing    Stand, right foot behind, heel on floor and turned slightly out. Lower hips and bend knees. Hold _30__ seconds. Repeat __3_ times per session. Do _2-3__ sessions per day.    Balance: Unilateral    Attempt to balance on left leg, eyes open. Hold __20-30__ seconds. Repeat __3-5__ times per set.  Do __2-3__ sessions per day.  Walking heel to toe and backward walking  SLOW down and make even steps with walking - make sure yoy are on each leg an ezual amount of time

## 2017-08-05 ENCOUNTER — Ambulatory Visit (INDEPENDENT_AMBULATORY_CARE_PROVIDER_SITE_OTHER): Payer: No Typology Code available for payment source | Admitting: Physical Therapy

## 2017-08-05 DIAGNOSIS — M6281 Muscle weakness (generalized): Secondary | ICD-10-CM

## 2017-08-05 DIAGNOSIS — M25672 Stiffness of left ankle, not elsewhere classified: Secondary | ICD-10-CM | POA: Diagnosis not present

## 2017-08-05 DIAGNOSIS — M25571 Pain in right ankle and joints of right foot: Secondary | ICD-10-CM

## 2017-08-05 DIAGNOSIS — R2689 Other abnormalities of gait and mobility: Secondary | ICD-10-CM | POA: Diagnosis not present

## 2017-08-05 NOTE — Therapy (Signed)
[place], [place], [address] Phone: [phone]   Fax:  [phone]  Physical Therapy Treatment  Patient Details  Name: KEMIYA BATDORF MRN: [id number] Date of Birth: 1965/02/20 Referring Provider: Dr. Wylene Simmer   Encounter Date: 08/05/2017  PT End of Session - 08/05/17 0808    Visit Number  4    Number of Visits  12    Date for PT Re-Evaluation  09/04/17    PT Start Time  0805    PT Stop Time  0902    PT Time Calculation (min)  57 min    Activity Tolerance  Patient tolerated treatment well    Behavior During Therapy  [facility] for tasks assessed/performed       Past Medical History:  Diagnosis Date  . High risk HPV infection 10/13   neg pap, +HR HPV, 16/18 genotype neg  . LGSIL (low grade squamous intraepithelial dysplasia)    12/14 HPV HR not detected  . Stress fracture     Past Surgical History:  Procedure Laterality Date  . COLPOSCOPY    . HTA (ablation)  08/10/04  . REFRACTIVE SURGERY  2003  . TUBAL LIGATION     BTSP    There were no vitals filed for this visit.  Subjective Assessment - 08/05/17 0808    Subjective  Pt reports she rode back from [place] (13 hrs) yesterday.  Sitting still has been the worst.  Icing 1x/day.  She can tell when she does her exercises, "it's bending more".     Patient Stated Goals  walk comfortably get ready for trip to [place] the first of August     Currently in Pain?  Yes    Pain Score  2     Pain Location  Ankle    Pain Orientation  Right    Pain Descriptors / Indicators  Aching    Aggravating Factors   being still    Pain Relieving Factors  ice, elevation, medication.          [facility] PT Assessment - 08/05/17 0001      Assessment   Medical Diagnosis  Bimalolar fx Rt ankle     Referring Provider  Dr. Wylene Simmer    Onset Date/Surgical Date  05/03/17    Hand Dominance  Right    Next MD Visit  08/19/17    Prior Therapy  no       AROM   Right Ankle Dorsiflexion  13    Right Ankle Plantar Flexion  31    Right Ankle Inversion  24    Right Ankle Eversion  24    Left Ankle Dorsiflexion  15       [facility] Adult PT Treatment/Exercise - 08/05/17 0001      Knee/Hip Exercises: Standing   Gait Training  semi-tandem stance with repeated Rt foot flat to toe off to increase ROM of toe ext (20 reps, range to tolerance);  40 ft lengths of gait training with VC for increased weight shift, even step length, and Rt toe off; tactile cues for reciprocal arm swing.  Improved quality of gait with increased distance/repetition.       Modalities   Modalities  Vasopneumatic;Ultrasound      Ultrasound   Ultrasound Location  Rt medial ankle (around malleoli)     Ultrasound Parameters  50%, 1.0 w/cm2, 8 min     Ultrasound Goals  Pain;Edema      Vasopneumatic  Number Minutes Vasopneumatic   15 minutes for edema     Vasopnuematic Location   Ankle Rt    Vasopneumatic Pressure  Medium    Vasopneumatic Temperature   34 deg      Manual Therapy   Manual Therapy  Taping    Kinesiotex  Edema      Kinesiotix   Edema  I strip of [place] tape applied to medial Rt ankle, malleoli up tibia 3", perpendicular strip applied with 50% stretch to decompress tissue over maleoli      Ankle Exercises: Aerobic   Nustep  L5 x 6 min for ROM       Ankle Exercises: Stretches   Soleus Stretch  2 reps;20 seconds    Gastroc Stretch  3 reps;30 seconds      Ankle Exercises: Standing   SLS  20 sec x 4 each LE - HHA as needed for balance    Other Standing Ankle Exercises  Rt ankle circles and alphabet     Other Standing Ankle Exercises  retro step ups and step down LLE x 10 reps on 3" step, 8 reps on 6" step      Ankle Exercises: Seated   Ankle Circles/Pumps  AROM;Right;5 reps    Heel Raises  5 reps    Toe Raise  5 reps      Ankle Exercises: Supine   T-Band  verbally reviewed band exercises for Rt ankle.                   PT Long Term Goals -  08/05/17 0847      PT LONG TERM GOAL #1   Title  Increase ROM Rt ankle by 5-8 degrees in all planes 09/04/17    Time  6    Period  Weeks    Status  Partially Met      PT LONG TERM GOAL #2   Title  Improve gait pattern with patient to demonstrate normal gait pattern for functional distances on level surfaces and ascend/descend steps safely and without difficulty 09/04/17    Time  6    Period  Weeks    Status  On-going progressing      PT LONG TERM GOAL #3   Title  Patient reports confidence in travel and vacation plans for trip in August 09/04/17    Time  6    Period  Weeks    Status  On-going      PT LONG TERM GOAL #4   Title  Independent in HEP 09/04/17    Time  6    Period  Weeks    Status  On-going      PT LONG TERM GOAL #5   Title  Improve FOTO to </= 33% limitation 09/04/17    Time  6    Period  Weeks    Status  On-going            Plan - 08/05/17 0848    Clinical Impression Statement  Improved Rt ankle ROM since last visit. She is able to ascend/descend 3 and 6" steps with minimal difficulty.  Continues with gait deviations; will benefit from continued gait training.  Progressing towards goals.       Rehab Potential  Good    PT Frequency  2x / week    PT Duration  6 weeks    PT Treatment/Interventions  Patient/family education;ADLs/Self Care Home Management;Cryotherapy;Electrical Stimulation;Iontophoresis 8m/ml Dexamethasone;Moist Heat;Ultrasound;Dry needling;Manual techniques;Neuromuscular re-education;Gait training;Stair training;Functional mobility training;Therapeutic  activities;Therapeutic exercise;Balance training    PT Next Visit Plan  assess response to [facility] tape. continue progressive ankle ther ex and modalities.     Consulted and Agree with Plan of Care  Patient       Patient will benefit from skilled therapeutic intervention in order to improve the following deficits and impairments:  Postural dysfunction, Improper body mechanics, Pain, Increased fascial  restricitons, Increased muscle spasms, Decreased strength, Decreased range of motion, Decreased mobility, Decreased activity tolerance, Abnormal gait, Decreased balance  Visit Diagnosis: Pain in right ankle and joints of right foot  Stiffness of left ankle, not elsewhere classified  Muscle weakness (generalized)  Other abnormalities of gait and mobility     Problem List Patient Active Problem List   Diagnosis Date Noted  . METATARSALGIA 07/19/2008  . HAMMER TOE, ACQUIRED 07/19/2008  . ANKLE SPRAIN, RIGHT 07/19/2008  . KNEE PAIN, LEFT 06/02/2008  . ILIOTIBIAL BAND SYNDROME 06/02/2008   Kerin Perna, PTA 08/05/17 1:00 PM  [place], [place], [address] Phone: [phone]   Fax:  [phone]  Name: FE OKUBO MRN: [id number] Date of Birth: 06-01-1965

## 2017-08-08 ENCOUNTER — Ambulatory Visit (INDEPENDENT_AMBULATORY_CARE_PROVIDER_SITE_OTHER): Payer: No Typology Code available for payment source | Admitting: Physical Therapy

## 2017-08-08 DIAGNOSIS — M25571 Pain in right ankle and joints of right foot: Secondary | ICD-10-CM

## 2017-08-08 DIAGNOSIS — M6281 Muscle weakness (generalized): Secondary | ICD-10-CM

## 2017-08-08 DIAGNOSIS — R2689 Other abnormalities of gait and mobility: Secondary | ICD-10-CM

## 2017-08-08 NOTE — Therapy (Signed)
[place], [place], [address] Phone: [phone]   Fax:  [phone]  Physical Therapy Treatment  Patient Details  Name: Nicole Carlson MRN: [id number] Date of Birth: 05-25-1965 Referring Provider: Dr. Wylene Simmer   Encounter Date: 08/08/2017  PT End of Session - 08/08/17 0813    Visit Number  5    Number of Visits  12    Date for PT Re-Evaluation  09/04/17    PT Start Time  0805    PT Stop Time  0902    PT Time Calculation (min)  57 min    Activity Tolerance  Patient tolerated treatment well    Behavior During Therapy  [facility] for tasks assessed/performed       Past Medical History:  Diagnosis Date  . High risk HPV infection 10/13   neg pap, +HR HPV, 16/18 genotype neg  . LGSIL (low grade squamous intraepithelial dysplasia)    12/14 HPV HR not detected  . Stress fracture     Past Surgical History:  Procedure Laterality Date  . COLPOSCOPY    . HTA (ablation)  08/10/04  . REFRACTIVE SURGERY  2003  . TUBAL LIGATION     BTSP    There were no vitals filed for this visit.  Subjective Assessment - 08/08/17 0814    Subjective  Pt reports she has been working on her gait and stairs.  She wore ankle compression yesterday and it helped some.  She arrives to the clinic with leuko tape appllied to ankle, "it's the tape [facility] told me to buy for my back".     Patient Stated Goals  walk comfortably get ready for trip to [place] the first of August     Currently in Pain?  Yes    Pain Score  2     Pain Location  Ankle    Pain Orientation  Right    Pain Descriptors / Indicators  Simonne Martinet PT Assessment - 08/08/17 0001      Assessment   Medical Diagnosis  Bimalolar fx Rt ankle     Referring Provider  Dr. Wylene Simmer    Onset Date/Surgical Date  05/03/17    Hand Dominance  Right    Next MD Visit  08/19/17         [facility] Adult PT Treatment/Exercise - 08/08/17 0001       Vasopneumatic   Number Minutes Vasopneumatic   15 minutes    Vasopnuematic Location   Ankle Rt    Vasopneumatic Pressure  Medium    Vasopneumatic Temperature   34 deg      Kinesiotix   Edema  Leuko tape/ prewrap removed from Rt ankle. I strip of Reg Rock tape applied to medial Rt ankle, malleoli up tibia 3", perpendicular strip applied with 50% stretch to decompress tissue over maleoli;  I strip of reg Rock tape with zig zag pattern over incision on Rt lateral ankle for scar management.       Ankle Exercises: Seated   BAPS  Sitting;Level 3;Level 4;10 reps PF/DF, eversion,/inv, CW/CCW      Ankle Exercises: Standing   SLS  SLS - LLE 30 sec, RLE up to 15 sec without UE support x 2 reps; SLS on blue pad with occasional support     Heel Raises  Both;10 reps    Other Standing Ankle Exercises  B heel raise with wt shift Rt,  lowering down with RLE only x 3 reps (fatigued quickly).    Tandem stance on 1/2 foam roller, flat side down x 30 sec each foot forward.    Rt ankle circles in between exercises to reduce tightness.       Ankle Exercises: Stretches   Soleus Stretch  1 rep;30 seconds    Gastroc Stretch  1 rep;30 seconds      Ankle Exercises: Supine   T-Band  inversion, eversion, DF with Rt ankle, Red band x 10 each                  PT Long Term Goals - 08/05/17 0847      PT LONG TERM GOAL #1   Title  Increase ROM Rt ankle by 5-8 degrees in all planes 09/04/17    Time  6    Period  Weeks    Status  Partially Met      PT LONG TERM GOAL #2   Title  Improve gait pattern with patient to demonstrate normal gait pattern for functional distances on level surfaces and ascend/descend steps safely and without difficulty 09/04/17    Time  6    Period  Weeks    Status  On-going progressing      PT LONG TERM GOAL #3   Title  Patient reports confidence in travel and vacation plans for trip in August 09/04/17    Time  6    Period  Weeks    Status  On-going      PT LONG TERM GOAL #4    Title  Independent in HEP 09/04/17    Time  6    Period  Weeks    Status  On-going      PT LONG TERM GOAL #5   Title  Improve FOTO to </= 33% limitation 09/04/17    Time  6    Period  Weeks    Status  On-going            Plan - 08/08/17 0857    Clinical Impression Statement  Pt tolerated exercises for Rt ankle with increased difficulty, with only mild reported increase in pain.  Swelling has reduced and ROM is improving with each visit.     Rehab Potential  Good    PT Frequency  2x / week    PT Duration  6 weeks    PT Treatment/Interventions  Patient/family education;ADLs/Self Care Home Management;Cryotherapy;Electrical Stimulation;Iontophoresis '4mg'$ /ml Dexamethasone;Moist Heat;Ultrasound;Dry needling;Manual techniques;Neuromuscular re-education;Gait training;Stair training;Functional mobility training;Therapeutic activities;Therapeutic exercise;Balance training    PT Next Visit Plan  continue progressive ankle ther ex and modalities.     Consulted and Agree with Plan of Care  Patient       Patient will benefit from skilled therapeutic intervention in order to improve the following deficits and impairments:  Postural dysfunction, Improper body mechanics, Pain, Increased fascial restricitons, Increased muscle spasms, Decreased strength, Decreased range of motion, Decreased mobility, Decreased activity tolerance, Abnormal gait, Decreased balance  Visit Diagnosis: Pain in right ankle and joints of right foot  Muscle weakness (generalized)  Other abnormalities of gait and mobility     Problem List Patient Active Problem List   Diagnosis Date Noted  . METATARSALGIA 07/19/2008  . HAMMER TOE, ACQUIRED 07/19/2008  . ANKLE SPRAIN, RIGHT 07/19/2008  . KNEE PAIN, LEFT 06/02/2008  . ILIOTIBIAL BAND SYNDROME 06/02/2008   Kerin Perna, PTA 08/08/17 9:07 AM  [place] [address] [place], [place],  [place] Phone: [phone]   Fax:  [phone]  Name: Nicole Carlson MRN: [id number] Date of Birth: [birth year]

## 2017-08-12 ENCOUNTER — Ambulatory Visit (INDEPENDENT_AMBULATORY_CARE_PROVIDER_SITE_OTHER): Payer: No Typology Code available for payment source | Admitting: Physical Therapy

## 2017-08-12 DIAGNOSIS — M25571 Pain in right ankle and joints of right foot: Secondary | ICD-10-CM

## 2017-08-12 DIAGNOSIS — M6281 Muscle weakness (generalized): Secondary | ICD-10-CM | POA: Diagnosis not present

## 2017-08-12 DIAGNOSIS — R2689 Other abnormalities of gait and mobility: Secondary | ICD-10-CM

## 2017-08-12 NOTE — Therapy (Signed)
[place], [place], [address] Phone: [phone]   Fax:  [phone]  Physical Therapy Treatment  Patient Details  Name: Nicole Carlson MRN: [id number] Date of Birth: August 01, 1965 Referring Provider: Dr. Wylene Simmer   Encounter Date: 08/12/2017  PT End of Session - 08/12/17 0808    Visit Number  6    Number of Visits  12    Date for PT Re-Evaluation  09/04/17    PT Start Time  0803    PT Stop Time  0859    PT Time Calculation (min)  56 min       Past Medical History:  Diagnosis Date  . High risk HPV infection 10/13   neg pap, +HR HPV, 16/18 genotype neg  . LGSIL (low grade squamous intraepithelial dysplasia)    12/14 HPV HR not detected  . Stress fracture     Past Surgical History:  Procedure Laterality Date  . COLPOSCOPY    . HTA (ablation)  08/10/04  . REFRACTIVE SURGERY  2003  . TUBAL LIGATION     BTSP    There were no vitals filed for this visit.  Subjective Assessment - 08/12/17 0808    Subjective  Pt reports she was in pool on Saturday doing exercises.  She was helping her daughter pack boxes yesterday, thinks she may have overdone it.  "It is tough first thing in the morning.  It is tight".   She walked 10 min Friday and 15 min Saturday (goal of 1 hr.)     Patient Stated Goals  walk comfortably get ready for trip to [place] the first of August     Currently in Pain?  Yes    Pain Score  4     Pain Location  Ankle    Pain Orientation  Right    Pain Descriptors / Indicators  Aching    Aggravating Factors   being still; overdoing it.    Pain Relieving Factors  ice, elevation, medication         [facility] PT Assessment - 08/12/17 0001      Assessment   Medical Diagnosis  Bimalolar fx Rt ankle     Referring Provider  Dr. Wylene Simmer    Onset Date/Surgical Date  05/03/17    Hand Dominance  Right    Next MD Visit  08/19/17       [facility] Adult PT Treatment/Exercise - 08/12/17 0001       Ultrasound   Ultrasound Location  Rt medial ankle     Ultrasound Parameters  50%, 1.0 w/cm2, 8 min     Ultrasound Goals  Edema;Pain      Vasopneumatic   Number Minutes Vasopneumatic   15 minutes    Vasopnuematic Location   Ankle Rt    Vasopneumatic Pressure  Medium    Vasopneumatic Temperature   34 deg      Kinesiotix   Edema  I strip of Reg Rock tape applied to medial Rt ankle, malleoli up tibia 3", perpendicular strip applied with 50% stretch to decompress tissue over maleoli;  I strip of reg Rock tape with zig zag pattern over incision on Rt lateral ankle for scar management.       Ankle Exercises: Seated   BAPS  Sitting;Level 4;10 reps PF/DF, eversion,/inv, CW/CCW      Ankle Exercises: Stretches   Soleus Stretch  3 reps;20 seconds    Gastroc Stretch  3 reps;20 seconds  Other Stretch  PRostretch x 20 sec      Ankle Exercises: Aerobic   Nustep  L4 x 5.5 min for ROM       Ankle Exercises: Standing   SLS  SLS - LLE 30 sec, RLE up to 15 sec without UE support x 2 reps    Heel Raises  Both;10 reps    Other Standing Ankle Exercises  tandem stance on therapad x 30 sec x 3 reps       Standing/sitting heel raise, RLE, with weight on LLE (to simulate rolliing through foot in toe off stance) x 10 each position.      PT Long Term Goals - 08/05/17 0847      PT LONG TERM GOAL #1   Title  Increase ROM Rt ankle by 5-8 degrees in all planes 09/04/17    Time  6    Period  Weeks    Status  Partially Met      PT LONG TERM GOAL #2   Title  Improve gait pattern with patient to demonstrate normal gait pattern for functional distances on level surfaces and ascend/descend steps safely and without difficulty 09/04/17    Time  6    Period  Weeks    Status  On-going progressing      PT LONG TERM GOAL #3   Title  Patient reports confidence in travel and vacation plans for trip in August 09/04/17    Time  6    Period  Weeks    Status  On-going      PT LONG TERM GOAL #4   Title   Independent in HEP 09/04/17    Time  6    Period  Weeks    Status  On-going      PT LONG TERM GOAL #5   Title  Improve FOTO to </= 33% limitation 09/04/17    Time  6    Period  Weeks    Status  On-going            Plan - 08/12/17 0846    Clinical Impression Statement  Pt had some increased swelling in Rt ankle, likely the result of overuse over the weekend.  Pt tolerated all exercises well, with mild increase in pain.  Pain reduced with elevation, [place] and vaso at end of session.  Progressing towards goals.     Rehab Potential  Good    PT Frequency  2x / week    PT Duration  6 weeks    PT Treatment/Interventions  Patient/family education;ADLs/Self Care Home Management;Cryotherapy;Electrical Stimulation;Iontophoresis 3m/ml Dexamethasone;Moist Heat;Ultrasound;Dry needling;Manual techniques;Neuromuscular re-education;Gait training;Stair training;Functional mobility training;Therapeutic activities;Therapeutic exercise;Balance training    PT Next Visit Plan  continue progressive ankle ther ex and modalities.   measure ROM.        Patient will benefit from skilled therapeutic intervention in order to improve the following deficits and impairments:  Postural dysfunction, Improper body mechanics, Pain, Increased fascial restricitons, Increased muscle spasms, Decreased strength, Decreased range of motion, Decreased mobility, Decreased activity tolerance, Abnormal gait, Decreased balance  Visit Diagnosis: Pain in right ankle and joints of right foot  Muscle weakness (generalized)  Other abnormalities of gait and mobility     Problem List Patient Active Problem List   Diagnosis Date Noted  . METATARSALGIA 07/19/2008  . HAMMER TOE, ACQUIRED 07/19/2008  . ANKLE SPRAIN, RIGHT 07/19/2008  . KNEE PAIN, LEFT 06/02/2008  . ILIOTIBIAL BAND SYNDROME 06/02/2008   JKerin Perna PTA 08/12/17 8:50 AM  [facility] [place], [place], [address] Phone: [phone]   Fax:  [phone]  Name: Nicole Carlson MRN: [id number] Date of Birth: [birth year]

## 2017-08-15 ENCOUNTER — Ambulatory Visit (INDEPENDENT_AMBULATORY_CARE_PROVIDER_SITE_OTHER): Payer: No Typology Code available for payment source | Admitting: Physical Therapy

## 2017-08-15 DIAGNOSIS — M25571 Pain in right ankle and joints of right foot: Secondary | ICD-10-CM

## 2017-08-15 DIAGNOSIS — M6281 Muscle weakness (generalized): Secondary | ICD-10-CM | POA: Diagnosis not present

## 2017-08-15 DIAGNOSIS — R2689 Other abnormalities of gait and mobility: Secondary | ICD-10-CM

## 2017-08-15 NOTE — Therapy (Signed)
[place], [place], [address] Phone: [phone]   Fax:  [phone]  Physical Therapy Treatment  Patient Details  Name: Nicole Carlson MRN: [id number] Date of Birth: [birth year] Referring Provider: Dr. Wylene Simmer   Encounter Date: 08/15/2017  PT End of Session - 08/15/17 0818    Visit Number  7    Number of Visits  12    Date for PT Re-Evaluation  09/04/17    PT Start Time  0800    PT Stop Time  0859    PT Time Calculation (min)  59 min       Past Medical History:  Diagnosis Date  . High risk HPV infection 10/13   neg pap, +HR HPV, 16/18 genotype neg  . LGSIL (low grade squamous intraepithelial dysplasia)    12/14 HPV HR not detected  . Stress fracture     Past Surgical History:  Procedure Laterality Date  . COLPOSCOPY    . HTA (ablation)  08/10/04  . REFRACTIVE SURGERY  2003  . TUBAL LIGATION     BTSP    There were no vitals filed for this visit.  Subjective Assessment - 08/15/17 0806    Subjective  Memory reports she walked 20 min (for exercise) 2 days ago, and it went well.  She is wearing compression sock part of each day.  Icing ankle daily.  Working on [employer] throughout day.      Patient Stated Goals  walk comfortably get ready for trip to [place] the first of August     Currently in Pain?  Yes    Pain Score  2     Pain Location  Ankle    Pain Orientation  Right    Pain Descriptors / Indicators  Dull    Aggravating Factors   being still; overdoing it    Pain Relieving Factors  ice, elevation, compression         [facility] PT Assessment - 08/15/17 0001      Assessment   Medical Diagnosis  Bimallolar fx, Rt ankle     Referring Provider  Dr. Wylene Simmer    Onset Date/Surgical Date  05/03/17    Hand Dominance  Right    Next MD Visit  08/19/17      AROM   Right Ankle Dorsiflexion  11    Right Ankle Plantar Flexion  37    Right Ankle Inversion  26    Right Ankle Eversion   26    Left Ankle Dorsiflexion  12    Left Ankle Plantar Flexion  62    Left Ankle Inversion  46    Left Ankle Eversion  30      PROM   PROM Assessment Site  Ankle    Right/Left Ankle  Left;Right    Right Ankle Dorsiflexion  10    Left Ankle Dorsiflexion  19         [facility] Adult PT Treatment/Exercise - 08/15/17 0001      Ultrasound   Ultrasound Location  Rt medial ankle musculature    Ultrasound Parameters  50%, 1.0 w/cm2, 3.3 mHz, 8 min     Ultrasound Goals  Pain;Edema      Vasopneumatic   Number Minutes Vasopneumatic   15 minutes    Vasopnuematic Location   Ankle Rt    Vasopneumatic Pressure  Medium    Vasopneumatic Temperature   34 deg      Manual Therapy  Manual Therapy  Taping    Kinesiotex  Edema      Kinesiotix   Edema  I strip of Reg Rock tape applied to medial Rt ankle, malleoli up tibia 3", perpendicular strip applied with 50% stretch to decompress tissue over maleoli;  I strip of reg Rock tape with zig zag pattern over incision on Rt lateral ankle for scar management.       Ankle Exercises: Seated   BAPS  Sitting;Level 4;10 reps PF/DF, eversion,/inv, CW/CCW      Ankle Exercises: Stretches   Gastroc Stretch  3 reps;20 seconds foot on prostretch, bilat      Ankle Exercises: Supine   T-Band  inversion x 20, eversion x 10, DF with Rt ankle x 10, Red band       Ankle Exercises: Standing   Heel Raises  Both;5 reps no shoes    Other Standing Ankle Exercises  tandem stance on 1/2 foam roller (flat side down) x 30 sec x 2 reps. Wide stance balance, with feet on 1/2 foam roller (flat side up) x 30 sec with occasional UE support.    SLS Rt LE:  3 reps, up to 15 seconds.                    PT Long Term Goals - 08/05/17 0847      PT LONG TERM GOAL #1   Title  Increase ROM Rt ankle by 5-8 degrees in all planes 09/04/17    Time  6    Period  Weeks    Status  Partially Met      PT LONG TERM GOAL #2   Title  Improve gait pattern with patient to  demonstrate normal gait pattern for functional distances on level surfaces and ascend/descend steps safely and without difficulty 09/04/17    Time  6    Period  Weeks    Status  On-going progressing      PT LONG TERM GOAL #3   Title  Patient reports confidence in travel and vacation plans for trip in August 09/04/17    Time  6    Period  Weeks    Status  On-going      PT LONG TERM GOAL #4   Title  Independent in HEP 09/04/17    Time  6    Period  Weeks    Status  On-going      PT LONG TERM GOAL #5   Title  Improve FOTO to </= 33% limitation 09/04/17    Time  6    Period  Weeks    Status  On-going            Plan - 08/15/17 1511    Clinical Impression Statement  Pt gradually gaining ROM in Rt ankle with each visit.  Rt SLS has increased to 15 seconds without support. She can ascend/descend stairs now without difficulty. Gait continues to be antalgic at time; improved with cues when ambulating between exercises. She has partially met LTG #1 and is making good gains towards remaining goals.  Pt will benefit from continued PT intervention to maximize functional mobility and safety.     Rehab Potential  Good    PT Frequency  2x / week    PT Duration  6 weeks    PT Treatment/Interventions  Patient/family education;ADLs/Self Care Home Management;Cryotherapy;Electrical Stimulation;Iontophoresis 42m/ml Dexamethasone;Moist Heat;Ultrasound;Dry needling;Manual techniques;Neuromuscular re-education;Gait training;Stair training;Functional mobility training;Therapeutic activities;Therapeutic exercise;Balance training    PT Next Visit Plan  continue progressive ankle ther ex and modalities.      Consulted and Agree with Plan of Care  Patient       Patient will benefit from skilled therapeutic intervention in order to improve the following deficits and impairments:  Postural dysfunction, Improper body mechanics, Pain, Increased fascial restricitons, Increased muscle spasms, Decreased strength,  Decreased range of motion, Decreased mobility, Decreased activity tolerance, Abnormal gait, Decreased balance  Visit Diagnosis: Pain in right ankle and joints of right foot  Muscle weakness (generalized)  Other abnormalities of gait and mobility     Problem List Patient Active Problem List   Diagnosis Date Noted  . METATARSALGIA 07/19/2008  . HAMMER TOE, ACQUIRED 07/19/2008  . ANKLE SPRAIN, RIGHT 07/19/2008  . KNEE PAIN, LEFT 06/02/2008  . ILIOTIBIAL BAND SYNDROME 06/02/2008   Kerin Perna, PTA 08/15/17 3:14 PM  [facility] [place], [place], [address] Phone: [phone]   Fax:  [phone]  Name: VINAYA SANCHO MRN: [id number] Date of Birth: May 09, 1965

## 2017-08-19 ENCOUNTER — Encounter: Payer: No Typology Code available for payment source | Admitting: Physical Therapy

## 2017-08-20 ENCOUNTER — Ambulatory Visit (INDEPENDENT_AMBULATORY_CARE_PROVIDER_SITE_OTHER): Payer: No Typology Code available for payment source | Admitting: Rehabilitative and Restorative Service Providers"

## 2017-08-20 ENCOUNTER — Encounter: Payer: Self-pay | Admitting: Rehabilitative and Restorative Service Providers"

## 2017-08-20 DIAGNOSIS — M25672 Stiffness of left ankle, not elsewhere classified: Secondary | ICD-10-CM | POA: Diagnosis not present

## 2017-08-20 DIAGNOSIS — M25571 Pain in right ankle and joints of right foot: Secondary | ICD-10-CM | POA: Diagnosis not present

## 2017-08-20 DIAGNOSIS — R2689 Other abnormalities of gait and mobility: Secondary | ICD-10-CM | POA: Diagnosis not present

## 2017-08-20 DIAGNOSIS — M6281 Muscle weakness (generalized): Secondary | ICD-10-CM | POA: Diagnosis not present

## 2017-08-20 NOTE — Therapy (Addendum)
[place], [place], [address] Phone: [phone]   Fax:  [phone]  Physical Therapy Treatment  Patient Details  Name: Nicole Carlson MRN: [id number] Date of Birth: 12-10-1965 Referring Provider: Dr Wylene Simmer    Encounter Date: 08/20/2017  PT End of Session - 08/20/17 0804    Visit Number  8    Number of Visits  12    Date for PT Re-Evaluation  09/04/17    PT Start Time  0800    PT Stop Time  1740    PT Time Calculation (min)  57 min    Activity Tolerance  Patient tolerated treatment well       Past Medical History:  Diagnosis Date  . High risk HPV infection 10/13   neg pap, +HR HPV, 16/18 genotype neg  . LGSIL (low grade squamous intraepithelial dysplasia)    12/14 HPV HR not detected  . Stress fracture     Past Surgical History:  Procedure Laterality Date  . COLPOSCOPY    . HTA (ablation)  08/10/04  . REFRACTIVE SURGERY  2003  . TUBAL LIGATION     BTSP    There were no vitals filed for this visit.  Subjective Assessment - 08/20/17 0805    Subjective  Patient reports that she saw the doctor yesterday. He is pleased with her progress and told her she would have some soreness and pain/discomfort. She will have the screws removed in November.     Currently in Pain?  Yes    Pain Score  2     Pain Location  Ankle    Pain Orientation  Right    Pain Descriptors / Indicators  Dull;Aching         [facility] PT Assessment - 08/20/17 0001      Assessment   Medical Diagnosis  Bimallolar fx, Rt ankle     Referring Provider  Dr Wylene Simmer    Onset Date/Surgical Date  05/03/17    Hand Dominance  Right    Next MD Visit  08/19/17      AROM   Right Ankle Dorsiflexion  11    Right Ankle Plantar Flexion  38                   [facility] Adult PT Treatment/Exercise - 08/20/17 0001      Knee/Hip Exercises: Stretches   Gastroc Stretch  Right;Left;3 reps;30 seconds    Soleus  Stretch  Right;Left;3 reps;30 seconds    Other Knee/Hip Stretches  slant board positions #2 x 3 reps 60 sec each       Iontophoresis   Location  medial Rt ankle     Dose  80 mAmp    Time  14 hours       Vasopneumatic   Number Minutes Vasopneumatic   15 minutes    Vasopnuematic Location   Ankle Rt    Vasopneumatic Pressure  Medium    Vasopneumatic Temperature   34 deg      Manual Therapy   Manual therapy comments  pt supine     Joint Mobilization  mobs through the calcaneous and talus; tarsals; metatarsals; stretching through toes     Passive ROM  PROM/stretch Rt ankle DF/PF/INV/EVE 2-3 reps each LE       Ankle Exercises: Standing   Other Standing Ankle Exercises  tandem stance x 30 sec x 2 reps; alternate toe tap to 12 inch step slow and  fast; working on gait with very slow step and hold, step and hold x 80 ft x 2      Ankle Exercises: Aerobic   Nustep  L5 x 6 min for ROM              PT Education - 08/20/17 0854    Education Details  ionto    Person(s) Educated  Patient    Methods  Explanation    Comprehension  Verbalized understanding          PT Long Term Goals - 08/20/17 0804      PT LONG TERM GOAL #1   Title  Increase ROM Rt ankle by 5-8 degrees in all planes 09/04/17    Time  6    Period  Weeks    Status  Partially Met      PT LONG TERM GOAL #2   Title  Improve gait pattern with patient to demonstrate normal gait pattern for functional distances on level surfaces and ascend/descend steps safely and without difficulty 09/04/17    Time  6    Period  Weeks    Status  On-going      PT LONG TERM GOAL #3   Title  Patient reports confidence in travel and vacation plans for trip in August 09/04/17    Time  6    Period  Weeks    Status  On-going      PT LONG TERM GOAL #4   Title  Independent in HEP 09/04/17    Time  6    Period  Weeks    Status  On-going      PT LONG TERM GOAL #5   Title  Improve FOTO to </= 33% limitation 09/04/17    Time  6    Period   Weeks    Status  On-going            Plan - 08/20/17 0844    Clinical Impression Statement  Continued progress. Increasing ROM and improving gait/mobility. MD pleased with progress. He will schedule hardward removal for Novemeber. Trial of ionto for pain and sensitivity medal Rt ankle.     Rehab Potential  Good    PT Frequency  2x / week    PT Duration  6 weeks    PT Treatment/Interventions  Patient/family education;ADLs/Self Care Home Management;Cryotherapy;Electrical Stimulation;Iontophoresis 40m/ml Dexamethasone;Moist Heat;Ultrasound;Dry needling;Manual techniques;Neuromuscular re-education;Gait training;Stair training;Functional mobility training;Therapeutic activities;Therapeutic exercise;Balance training    PT Next Visit Plan  continue progressive ankle ther ex and modalities; assess response to ionto    Consulted and Agree with Plan of Care  Patient       Patient will benefit from skilled therapeutic intervention in order to improve the following deficits and impairments:  Postural dysfunction, Improper body mechanics, Pain, Increased fascial restricitons, Increased muscle spasms, Decreased strength, Decreased range of motion, Decreased mobility, Decreased activity tolerance, Abnormal gait, Decreased balance  Visit Diagnosis: Pain in right ankle and joints of right foot  Muscle weakness (generalized)  Other abnormalities of gait and mobility  Stiffness of left ankle, not elsewhere classified     Problem List Patient Active Problem List   Diagnosis Date Noted  . METATARSALGIA 07/19/2008  . HAMMER TOE, ACQUIRED 07/19/2008  . ANKLE SPRAIN, RIGHT 07/19/2008  . KNEE PAIN, LEFT 06/02/2008  . ILIOTIBIAL BAND SYNDROME 06/02/2008    Malala Trenkamp PNilda SimmerPT, MPH  08/20/2017, 8:55 AM  [facility] [address]  Phone: [phone]   Fax:  [phone]  Name: Nicole Carlson MRN:  [id number] Date of Birth: 1965-07-16

## 2017-08-20 NOTE — Patient Instructions (Signed)

## 2017-08-22 ENCOUNTER — Encounter: Payer: Self-pay | Admitting: Rehabilitative and Restorative Service Providers"

## 2017-08-22 ENCOUNTER — Ambulatory Visit (INDEPENDENT_AMBULATORY_CARE_PROVIDER_SITE_OTHER): Payer: No Typology Code available for payment source | Admitting: Rehabilitative and Restorative Service Providers"

## 2017-08-22 DIAGNOSIS — M6281 Muscle weakness (generalized): Secondary | ICD-10-CM

## 2017-08-22 DIAGNOSIS — M25571 Pain in right ankle and joints of right foot: Secondary | ICD-10-CM | POA: Diagnosis not present

## 2017-08-22 DIAGNOSIS — M25672 Stiffness of left ankle, not elsewhere classified: Secondary | ICD-10-CM

## 2017-08-22 DIAGNOSIS — R2689 Other abnormalities of gait and mobility: Secondary | ICD-10-CM | POA: Diagnosis not present

## 2017-08-22 NOTE — Therapy (Signed)
[place], [place], [address] Phone: [phone]   Fax:  [phone]  Physical Therapy Treatment  Patient Details  Name: Nicole Carlson MRN: [id number] Date of Birth: November 17, 1965 Referring Provider: Dr Wylene Simmer   Encounter Date: 08/22/2017  PT End of Session - 08/22/17 0810    Visit Number  9    Number of Visits  12    Date for PT Re-Evaluation  09/04/17    PT Start Time  0802    PT Stop Time  0900    PT Time Calculation (min)  58 min    Activity Tolerance  Patient tolerated treatment well       Past Medical History:  Diagnosis Date  . High risk HPV infection 10/13   neg pap, +HR HPV, 16/18 genotype neg  . LGSIL (low grade squamous intraepithelial dysplasia)    12/14 HPV HR not detected  . Stress fracture     Past Surgical History:  Procedure Laterality Date  . COLPOSCOPY    . HTA (ablation)  08/10/04  . REFRACTIVE SURGERY  2003  . TUBAL LIGATION     BTSP    There were no vitals filed for this visit.  Subjective Assessment - 08/22/17 0810    Subjective  Patient reports that she had good results with the ionto patch. She walked for 1.5 miles yesterday and was sore but did OK.     Currently in Pain?  Yes    Pain Score  2     Pain Location  Ankle    Pain Orientation  Right    Pain Descriptors / Indicators  Aching;Dull                       [facility] Adult PT Treatment/Exercise - 08/22/17 0001      Knee/Hip Exercises: Stretches   Gastroc Stretch  Right;Left;3 reps;30 seconds    Soleus Stretch  Right;Left;3 reps;30 seconds    Other Knee/Hip Stretches  slant board positions #2 x 2 reps 60 sec each       Knee/Hip Exercises: Sidelying   Hip ABduction  AROM;Right;Left;2 sets;10 reps leading with heel - eversion with ankle/foot on Rt       Iontophoresis   Location  medial Rt ankle     Dose  80 mAmp    Time  14 hours       Vasopneumatic   Number Minutes  Vasopneumatic   15 minutes    Vasopnuematic Location   Ankle Rt    Vasopneumatic Pressure  Medium    Vasopneumatic Temperature   34 deg      Manual Therapy   Manual therapy comments  pt supine     Joint Mobilization  mobs through the calcaneous and talus; tarsals; metatarsals; stretching through toes     Passive ROM  PROM/stretch Rt ankle DF/PF/INV/EVE 2-3 reps each LE       Ankle Exercises: Aerobic   Elliptical  trial - created increased pain     Recumbent Bike  for ROM - no resistance x 5 min       Ankle Exercises: Stretches   Gastroc Stretch  2 reps;60 seconds prostretch       Ankle Exercises: Supine   Other Supine Ankle Exercises  ankle DF slow reps x 20      Ankle Exercises: Standing   Other Standing Ankle Exercises  walking laps in the gym between exercises focus on  heel toe with good cadence and weight shift                   PT Long Term Goals - 08/20/17 0804      PT LONG TERM GOAL #1   Title  Increase ROM Rt ankle by 5-8 degrees in all planes 09/04/17    Time  6    Period  Weeks    Status  Partially Met      PT LONG TERM GOAL #2   Title  Improve gait pattern with patient to demonstrate normal gait pattern for functional distances on level surfaces and ascend/descend steps safely and without difficulty 09/04/17    Time  6    Period  Weeks    Status  On-going      PT LONG TERM GOAL #3   Title  Patient reports confidence in travel and vacation plans for trip in August 09/04/17    Time  6    Period  Weeks    Status  On-going      PT LONG TERM GOAL #4   Title  Independent in HEP 09/04/17    Time  6    Period  Weeks    Status  On-going      PT LONG TERM GOAL #5   Title  Improve FOTO to </= 33% limitation 09/04/17    Time  6    Period  Weeks    Status  On-going            Plan - 08/22/17 0626    Clinical Impression Statement  Patient continues to improve functionally. She reports decreased pain in the medial ankle with the ionto patch. Patient  leaves for vacation 09/06/17 will continue PT until her trip. Progress continues with mobility and gait improving.     Rehab Potential  Good    PT Frequency  2x / week    PT Duration  6 weeks    PT Treatment/Interventions  Patient/family education;ADLs/Self Care Home Management;Cryotherapy;Electrical Stimulation;Iontophoresis 60m/ml Dexamethasone;Moist Heat;Ultrasound;Dry needling;Manual techniques;Neuromuscular re-education;Gait training;Stair training;Functional mobility training;Therapeutic activities;Therapeutic exercise;Balance training    PT Next Visit Plan  continue progressive ankle ther ex and modalities; continue ionto       Patient will benefit from skilled therapeutic intervention in order to improve the following deficits and impairments:  Postural dysfunction, Improper body mechanics, Pain, Increased fascial restricitons, Increased muscle spasms, Decreased strength, Decreased range of motion, Decreased mobility, Decreased activity tolerance, Abnormal gait, Decreased balance  Visit Diagnosis: Pain in right ankle and joints of right foot  Muscle weakness (generalized)  Other abnormalities of gait and mobility  Stiffness of left ankle, not elsewhere classified     Problem List Patient Active Problem List   Diagnosis Date Noted  . METATARSALGIA 07/19/2008  . HAMMER TOE, ACQUIRED 07/19/2008  . ANKLE SPRAIN, RIGHT 07/19/2008  . KNEE PAIN, LEFT 06/02/2008  . ILIOTIBIAL BAND SYNDROME 06/02/2008    Renel Ende PNilda SimmerPT, MPH  08/22/2017, 8:48 AM  [facility] [address]Phone: [phone]  Fax:  [phone] Name: JSEYMONE FORLENZAMRN: [id number] of Birth: [birth year]

## 2017-08-27 ENCOUNTER — Encounter: Payer: Self-pay | Admitting: Rehabilitative and Restorative Service Providers"

## 2017-08-27 ENCOUNTER — Ambulatory Visit (INDEPENDENT_AMBULATORY_CARE_PROVIDER_SITE_OTHER): Payer: No Typology Code available for payment source | Admitting: Rehabilitative and Restorative Service Providers"

## 2017-08-27 DIAGNOSIS — R2689 Other abnormalities of gait and mobility: Secondary | ICD-10-CM | POA: Diagnosis not present

## 2017-08-27 DIAGNOSIS — M25571 Pain in right ankle and joints of right foot: Secondary | ICD-10-CM

## 2017-08-27 DIAGNOSIS — M6281 Muscle weakness (generalized): Secondary | ICD-10-CM | POA: Diagnosis not present

## 2017-08-27 DIAGNOSIS — M25672 Stiffness of left ankle, not elsewhere classified: Secondary | ICD-10-CM | POA: Diagnosis not present

## 2017-08-27 NOTE — Therapy (Signed)
[place], [place], [address] Phone: [phone]   Fax:  [phone]  Physical Therapy Treatment  Patient Details  Name: Nicole Carlson MRN: [id number] Date of Birth: [birth year] Referring Provider: Dr Wylene Simmer   Encounter Date: 08/27/2017  PT End of Session - 08/27/17 0805    Visit Number  10    Number of Visits  12    Date for PT Re-Evaluation  09/04/17    PT Start Time  0801    PT Stop Time  0900    PT Time Calculation (min)  59 min    Activity Tolerance  Patient tolerated treatment well       Past Medical History:  Diagnosis Date  . High risk HPV infection 10/13   neg pap, +HR HPV, 16/18 genotype neg  . LGSIL (low grade squamous intraepithelial dysplasia)    12/14 HPV HR not detected  . Stress fracture     Past Surgical History:  Procedure Laterality Date  . COLPOSCOPY    . HTA (ablation)  08/10/04  . REFRACTIVE SURGERY  2003  . TUBAL LIGATION     BTSP    There were no vitals filed for this visit.  Subjective Assessment - 08/27/17 0806    Subjective  NO pain! Walked for one hour Saturday and 1/2 hour Sunday with no problems. She is walking slower than she would like but improved. Ionto is helpful.     Currently in Pain?  No/denies         [facility] PT Assessment - 08/27/17 0001      Assessment   Medical Diagnosis  Bimallolar fx, Rt ankle     Referring Provider  Dr Wylene Simmer    Onset Date/Surgical Date  05/03/17    Hand Dominance  Right    Next MD Visit  PRN       Observation/Other Assessments   Focus on Therapeutic Outcomes (FOTO)   43% limitation       AROM   Right Ankle Dorsiflexion  14    Right Ankle Plantar Flexion  43    Right Ankle Inversion  34    Right Ankle Eversion  27    Left Ankle Dorsiflexion  12    Left Ankle Plantar Flexion  62    Left Ankle Inversion  46    Left Ankle Eversion  30      PROM   PROM Assessment Site  Ankle    Right/Left Ankle   Right    Right Ankle Dorsiflexion  15    Left Ankle Dorsiflexion  19      Strength   Right Ankle Dorsiflexion  5/5    Right Ankle Plantar Flexion  4+/5    Right Ankle Inversion  5/5    Right Ankle Eversion  5/5    Left Ankle Dorsiflexion  5/5    Left Ankle Plantar Flexion  5/5    Left Ankle Inversion  5/5    Left Ankle Eversion  5/5                   [facility] Adult PT Treatment/Exercise - 08/27/17 0001      Knee/Hip Exercises: Stretches   Gastroc Stretch  Right;Left;3 reps;30 seconds    Soleus Stretch  Right;Left;3 reps;30 seconds    Other Knee/Hip Stretches  slant board positions #2 x 2 reps 60 sec each       Knee/Hip Exercises: Standing   SLS  mini tramp ball bounce with SLS     SLS with Vectors  standing at an angle for bal toss     Other Standing Knee Exercises  mini tramp walking/marching/bouncing with mini bounce; jogging 1-2 min each activity       Iontophoresis   Location  medial Rt ankle     Dose  80 mAmp    Time  14 hours       Vasopneumatic   Number Minutes Vasopneumatic   15 minutes    Vasopnuematic Location   Ankle Rt    Vasopneumatic Pressure  Medium    Vasopneumatic Temperature   34 deg      Manual Therapy   Manual therapy comments  pt supine     Joint Mobilization  mobs through the calcaneous and talus; tarsals; metatarsals; stretching through toes     Passive ROM  PROM/stretch Rt ankle DF/PF/INV/EVE 2-3 reps each LE       Ankle Exercises: Aerobic   Stationary Bike  L3 x 6 min pillow for support       Ankle Exercises: Stretches   Soleus Stretch  3 reps;20 seconds    Gastroc Stretch  3 reps;30 seconds prostretch       Ankle Exercises: Standing   Other Standing Ankle Exercises  walking laps in the gym between exercises focus on heel toe with good cadence and weight shift                   PT Long Term Goals - 08/27/17 0809      PT LONG TERM GOAL #1   Title  Increase ROM Rt ankle by 5-8 degrees in all planes 09/04/17    Time  6     Period  Weeks    Status  Partially Met      PT LONG TERM GOAL #2   Title  Improve gait pattern with patient to demonstrate normal gait pattern for functional distances on level surfaces and ascend/descend steps safely and without difficulty 09/04/17    Time  6    Period  Weeks    Status  Partially Met      PT LONG TERM GOAL #3   Title  Patient reports confidence in travel and vacation plans for trip in August 09/04/17    Time  6    Period  Weeks    Status  On-going      PT LONG TERM GOAL #4   Title  Independent in HEP 09/04/17    Time  6    Period  Weeks    Status  On-going      PT LONG TERM GOAL #5   Title  Improve FOTO to </= 33% limitation 09/04/17    Time  6    Period  Weeks    Status  On-going            Plan - 08/27/17 0807    Clinical Impression Statement  Progressing well with decreased pain and increased functional mobility through the Rt ankle. ROM increasing. Good response to the Ionto at medial ankle. Leaves for vacation 09/06/17    Rehab Potential  Good    PT Frequency  2x / week    PT Duration  6 weeks    PT Treatment/Interventions  Patient/family education;ADLs/Self Care Home Management;Cryotherapy;Electrical Stimulation;Iontophoresis 38m/ml Dexamethasone;Moist Heat;Ultrasound;Dry needling;Manual techniques;Neuromuscular re-education;Gait training;Stair training;Functional mobility training;Therapeutic activities;Therapeutic exercise;Balance training    PT Next Visit Plan  continue progressive ankle ther ex and modalities;  continue ionto    Consulted and Agree with Plan of Care  Patient       Patient will benefit from skilled therapeutic intervention in order to improve the following deficits and impairments:  Postural dysfunction, Improper body mechanics, Pain, Increased fascial restricitons, Increased muscle spasms, Decreased strength, Decreased range of motion, Decreased mobility, Decreased activity tolerance, Abnormal gait, Decreased balance  Visit  Diagnosis: Pain in right ankle and joints of right foot  Muscle weakness (generalized)  Other abnormalities of gait and mobility  Stiffness of left ankle, not elsewhere classified     Problem List Patient Active Problem List   Diagnosis Date Noted  . METATARSALGIA 07/19/2008  . HAMMER TOE, ACQUIRED 07/19/2008  . ANKLE SPRAIN, RIGHT 07/19/2008  . KNEE PAIN, LEFT 06/02/2008  . ILIOTIBIAL BAND SYNDROME 06/02/2008    Hong Moring Nilda Simmer PT, MPH  08/27/2017, 8:43 AM  [facility] [place], [place], [address] Phone: [phone]   Fax:  [phone]  Name: LURETTA EVERLY MRN: [id number] Date of Birth: [birth year]

## 2017-08-29 ENCOUNTER — Ambulatory Visit (INDEPENDENT_AMBULATORY_CARE_PROVIDER_SITE_OTHER): Payer: No Typology Code available for payment source | Admitting: Rehabilitative and Restorative Service Providers"

## 2017-08-29 ENCOUNTER — Encounter: Payer: Self-pay | Admitting: Rehabilitative and Restorative Service Providers"

## 2017-08-29 DIAGNOSIS — R2689 Other abnormalities of gait and mobility: Secondary | ICD-10-CM | POA: Diagnosis not present

## 2017-08-29 DIAGNOSIS — M25672 Stiffness of left ankle, not elsewhere classified: Secondary | ICD-10-CM

## 2017-08-29 DIAGNOSIS — M25571 Pain in right ankle and joints of right foot: Secondary | ICD-10-CM

## 2017-08-29 DIAGNOSIS — M6281 Muscle weakness (generalized): Secondary | ICD-10-CM | POA: Diagnosis not present

## 2017-08-29 NOTE — Therapy (Signed)
[place], [place], [address] Phone: [phone]   Fax:  [phone]  Physical Therapy Treatment  Patient Details  Name: Nicole Carlson MRN: [id number] Date of Birth: 10-28-1965 Referring Provider: Dr Nicole Carlson   Encounter Date: 08/29/2017  Nicole Carlson End of Session - 08/29/17 0805    Visit Number  11    Number of Visits  12    Date for Nicole Carlson Re-Evaluation  09/04/17    Nicole Carlson Start Time  0759    Nicole Carlson Stop Time  0853    Nicole Carlson Time Calculation (min)  54 min    Activity Tolerance  Patient tolerated treatment well       Past Medical History:  Diagnosis Date  . High risk HPV infection 10/13   neg pap, +HR HPV, 16/18 genotype neg  . LGSIL (low grade squamous intraepithelial dysplasia)    12/14 HPV HR not detected  . Stress fracture     Past Surgical History:  Procedure Laterality Date  . COLPOSCOPY    . HTA (ablation)  08/10/04  . REFRACTIVE SURGERY  2003  . TUBAL LIGATION     BTSP    There were no vitals filed for this visit.  Subjective Assessment - 08/29/17 0804    Subjective  Walked 3 miles last night and had some soreness or tightness. Always has some stiffness in the morning when she gets up     Currently in Pain?  Yes    Pain Score  1                        [facility] Adult Nicole Carlson Treatment/Exercise - 08/29/17 0001      Knee/Hip Exercises: Stretches   Gastroc Stretch  Right;Left;3 reps;30 seconds    Soleus Stretch  Right;Left;3 reps;30 seconds    Other Knee/Hip Stretches  slant board positions #2 x 2 reps 60 sec each       Knee/Hip Exercises: Standing   Lateral Step Up  Right;15 reps;Hand Hold: 1;Step Height: 6" Lt heel tap     SLS  SLS with 2 inch air-ex 1 min x 3 reps     Walking with Sports Cord  back/fwd/each side pink cord x 10 reps each     Other Standing Knee Exercises  mini tramp walking/marching/bouncing with mini bounce; jogging; SLS; mini bounce Rt LE only 1-2 min each  activity       Iontophoresis   Type of Iontophoresis  Dexamethasone    Location  medial Rt ankle     Dose  120 mAmp    Time  12 hours       Vasopneumatic   Number Minutes Vasopneumatic   15 minutes    Vasopnuematic Location   Ankle Rt    Vasopneumatic Pressure  Medium    Vasopneumatic Temperature   34 deg      Manual Therapy   Manual therapy comments  Nicole Carlson supine     Joint Mobilization  mobs through the calcaneous and talus; tarsals; metatarsals; stretching through toes     Passive ROM  PROM/stretch Rt ankle DF/PF/INV/EVE 2-3 reps each LE       Ankle Exercises: Aerobic   Stationary Bike  L3 x 6 min pillow for support       Ankle Exercises: Stretches   Soleus Stretch  3 reps;20 seconds    Gastroc Stretch  3 reps;30 seconds prostretch       Ankle Exercises:  Standing   Other Standing Ankle Exercises  walking laps in the gym between exercises focus on heel toe with good cadence and weight shift                   Nicole Carlson Long Term Goals - 08/27/17 0809      Nicole Carlson LONG TERM GOAL #1   Title  Increase ROM Rt ankle by 5-8 degrees in all planes 09/04/17    Time  6    Period  Weeks    Status  Partially Met      Nicole Carlson LONG TERM GOAL #2   Title  Improve gait pattern with patient to demonstrate normal gait pattern for functional distances on level surfaces and ascend/descend steps safely and without difficulty 09/04/17    Time  6    Period  Weeks    Status  Partially Met      Nicole Carlson LONG TERM GOAL #3   Title  Patient reports confidence in travel and vacation plans for trip in August 09/04/17    Time  6    Period  Weeks    Status  On-going      Nicole Carlson LONG TERM GOAL #4   Title  Independent in HEP 09/04/17    Time  6    Period  Weeks    Status  On-going      Nicole Carlson LONG TERM GOAL #5   Title  Improve FOTO to </= 33% limitation 09/04/17    Time  6    Period  Weeks    Status  On-going            Plan - 08/29/17 4132    Clinical Impression Statement  Continued progress with patient  increasing her walking distance. She has less pain but continued stiffness. Continues to report improvement with ionto. Leaves for vacation 09/06/17. Progressing well toward stated goals of therapy.     Rehab Potential  Good    Nicole Carlson Frequency  2x / week    Nicole Carlson Duration  6 weeks    Nicole Carlson Treatment/Interventions  Patient/family education;ADLs/Self Care Home Management;Cryotherapy;Electrical Stimulation;Iontophoresis 27m/ml Dexamethasone;Moist Heat;Ultrasound;Dry needling;Manual techniques;Neuromuscular re-education;Gait training;Stair training;Functional mobility training;Therapeutic activities;Therapeutic exercise;Balance training    Nicole Carlson Next Visit Plan  continue progressive ankle ther ex and modalities; continue ionto    Consulted and Agree with Plan of Care  Patient       Patient will benefit from skilled therapeutic intervention in order to improve the following deficits and impairments:  Postural dysfunction, Improper body mechanics, Pain, Increased fascial restricitons, Increased muscle spasms, Decreased strength, Decreased range of motion, Decreased mobility, Decreased activity tolerance, Abnormal gait, Decreased balance  Visit Diagnosis: Pain in right ankle and joints of right foot  Muscle weakness (generalized)  Other abnormalities of gait and mobility  Stiffness of left ankle, not elsewhere classified     Problem List Patient Active Problem List   Diagnosis Date Noted  . METATARSALGIA 07/19/2008  . HAMMER TOE, ACQUIRED 07/19/2008  . ANKLE SPRAIN, RIGHT 07/19/2008  . KNEE PAIN, LEFT 06/02/2008  . ILIOTIBIAL BAND SYNDROME 06/02/2008    Nicole Carlson Nicole Carlson  08/29/2017, 8:44 AM  [facility] [address]Phone: [phone]  Fax:  [phone] Name: Nicole RINGSMRN: [id number] of Birth: 101-Nov-1967

## 2017-09-02 ENCOUNTER — Ambulatory Visit (INDEPENDENT_AMBULATORY_CARE_PROVIDER_SITE_OTHER): Payer: No Typology Code available for payment source | Admitting: Physical Therapy

## 2017-09-02 DIAGNOSIS — R2689 Other abnormalities of gait and mobility: Secondary | ICD-10-CM

## 2017-09-02 DIAGNOSIS — M6281 Muscle weakness (generalized): Secondary | ICD-10-CM | POA: Diagnosis not present

## 2017-09-02 DIAGNOSIS — M25571 Pain in right ankle and joints of right foot: Secondary | ICD-10-CM

## 2017-09-02 NOTE — Therapy (Signed)
[place], [place], [address] Phone: [phone]   Fax:  [phone]  Physical Therapy Treatment  Patient Details  Name: Nicole Carlson MRN: [id number] Date of Birth: 1966/01/18 Referring Provider: Dr. Wylene Simmer   Encounter Date: 09/02/2017  PT End of Session - 09/02/17 0805    Visit Number  12    Number of Visits  12    Date for PT Re-Evaluation  09/04/17    PT Start Time  0802    PT Stop Time  0900    PT Time Calculation (min)  58 min    Activity Tolerance  Patient tolerated treatment well    Behavior During Therapy  [facility] for tasks assessed/performed       Past Medical History:  Diagnosis Date  . High risk HPV infection 10/13   neg pap, +HR HPV, 16/18 genotype neg  . LGSIL (low grade squamous intraepithelial dysplasia)    12/14 HPV HR not detected  . Stress fracture     Past Surgical History:  Procedure Laterality Date  . COLPOSCOPY    . HTA (ablation)  08/10/04  . REFRACTIVE SURGERY  2003  . TUBAL LIGATION     BTSP    There were no vitals filed for this visit.  Subjective Assessment - 09/02/17 0805    Subjective  Pt reports she has been doing exercises daily.  She walked 3 miles 2x last week, at a slow pace. She is not having to ice her ankle as much.  She still struggles with going down deep steps.     Patient Stated Goals  walk comfortably get ready for trip to [place] the first of August     Currently in Pain?  Yes    Pain Score  1     Pain Location  Ankle    Pain Orientation  Right    Pain Descriptors / Indicators  Aching    Aggravating Factors   being still too long; overdoing it    Pain Relieving Factors  ice, elevation, compression          [facility] PT Assessment - 09/02/17 0001      Assessment   Medical Diagnosis  Bimallolar fx, Rt ankle     Referring Provider  Dr. Wylene Simmer       [facility] Adult PT Treatment/Exercise - 09/02/17 0001      Knee/Hip Exercises:  Stretches   Gastroc Stretch  Right;Left;2 reps;20 seconds    Other Knee/Hip Stretches  slant board positions #3 x 2 reps 60 sec each       Knee/Hip Exercises: Standing   Lateral Step Up  Right;15 reps;Hand Hold: 1;Step Height: 6" Lt heel tap     Step Down  Left;1 set;Hand Hold: 2;Limitations;15 reps 3" step; unable to perform 6"    Step Down Limitations  VC for foot alignment and knee alignment    Walking with Sports Cord  back/fwd/each side pink cord x 10 reps each     Other Standing Knee Exercises  mini tramp small hops, rocking horse motion, jogging in place      Iontophoresis   Type of Iontophoresis  Dexamethasone    Location  Rt medial ankle    Dose  1.0cc    Time  120 mA, 12 hrs      Vasopneumatic   Number Minutes Vasopneumatic   15 minutes    Vasopnuematic Location   Ankle Rt    Vasopneumatic Pressure  Medium    Vasopneumatic Temperature   34 deg      Manual Therapy   Passive ROM  PROM/stretch Rt ankle DF/PF/INV/EVE 2-3 reps each LE       Ankle Exercises: Aerobic   Stationary Bike  L3 x 5 min pillow for support  PTA present to discuss progress    Elliptical  trial - created increased pain, poor knee alignment- stopped        Ankle Exercises: Standing   SLS  Rt SLS on mini tramp with horiz head turns x 3 reps of 30 sec    Heel Walk (Round Trip)  20    Toe Walk (Round Trip)  20                  PT Long Term Goals - 09/02/17 0831      PT LONG TERM GOAL #1   Title  Increase ROM Rt ankle by 5-8 degrees in all planes 09/04/17    Time  6    Period  Weeks    Status  Achieved      PT LONG TERM GOAL #2   Title  Improve gait pattern with patient to demonstrate normal gait pattern for functional distances on level surfaces and ascend/descend steps safely and without difficulty 09/04/17    Time  6    Period  Weeks    Status  Partially Met stairs at home difficult      PT LONG TERM GOAL #3   Title  Patient reports confidence in travel and vacation plans for trip in  August 09/04/17    Time  6    Period  Weeks    Status  Partially Met      PT LONG TERM GOAL #4   Title  Independent in HEP 09/04/17    Time  6    Period  Weeks    Status  On-going      PT LONG TERM GOAL #5   Title  Improve FOTO to </= 33% limitation 09/04/17    Time  6    Period  Weeks    Status  On-going            Plan - 09/02/17 1343    Clinical Impression Statement  Pt continues to have difficulty with steps, with depth greater than 4"; uses compensatory strategies with Rt knee/hip.  Pt tolerated all other exercises well with only mild increase in pain. She has partially met LTG#1 and partially met LTG#3.      Rehab Potential  Good    PT Frequency  2x / week    PT Duration  6 weeks    PT Treatment/Interventions  Patient/family education;ADLs/Self Care Home Management;Cryotherapy;Electrical Stimulation;Iontophoresis 53m/ml Dexamethasone;Moist Heat;Ultrasound;Dry needling;Manual techniques;Neuromuscular re-education;Gait training;Stair training;Functional mobility training;Therapeutic activities;Therapeutic exercise;Balance training    PT Next Visit Plan  assess goals and need for additional sessions vs d/c to HEP.  Pt will be out of state until 8/19.      Consulted and Agree with Plan of Care  Patient       Patient will benefit from skilled therapeutic intervention in order to improve the following deficits and impairments:  Postural dysfunction, Improper body mechanics, Pain, Increased fascial restricitons, Increased muscle spasms, Decreased strength, Decreased range of motion, Decreased mobility, Decreased activity tolerance, Abnormal gait, Decreased balance  Visit Diagnosis: Pain in right ankle and joints of right foot  Muscle weakness (generalized)  Other abnormalities of gait and mobility  Problem List Patient Active Problem List   Diagnosis Date Noted  . METATARSALGIA 07/19/2008  . HAMMER TOE, ACQUIRED 07/19/2008  . ANKLE SPRAIN, RIGHT 07/19/2008  . KNEE  PAIN, LEFT 06/02/2008  . ILIOTIBIAL BAND SYNDROME 06/02/2008   Kerin Perna, PTA 09/02/17 1:49 PM  [facility] [place], [place], [address] Phone: [phone]   Fax:  [phone]  Name: Nicole Carlson MRN: [id number] Date of Birth: [birth year]

## 2017-09-04 ENCOUNTER — Encounter: Payer: No Typology Code available for payment source | Admitting: Rehabilitative and Restorative Service Providers"

## 2017-09-17 ENCOUNTER — Ambulatory Visit (INDEPENDENT_AMBULATORY_CARE_PROVIDER_SITE_OTHER): Payer: No Typology Code available for payment source | Admitting: Rehabilitative and Restorative Service Providers"

## 2017-09-17 ENCOUNTER — Encounter: Payer: Self-pay | Admitting: Rehabilitative and Restorative Service Providers"

## 2017-09-17 DIAGNOSIS — R2689 Other abnormalities of gait and mobility: Secondary | ICD-10-CM | POA: Diagnosis not present

## 2017-09-17 DIAGNOSIS — M25672 Stiffness of left ankle, not elsewhere classified: Secondary | ICD-10-CM

## 2017-09-17 DIAGNOSIS — M6281 Muscle weakness (generalized): Secondary | ICD-10-CM | POA: Diagnosis not present

## 2017-09-17 DIAGNOSIS — M25571 Pain in right ankle and joints of right foot: Secondary | ICD-10-CM

## 2017-09-17 NOTE — Therapy (Signed)
[place], [place], [address] Phone: [phone]   Fax:  [phone]  Physical Therapy Treatment  Patient Details  Name: Nicole Carlson MRN: [id number] Date of Birth: 09-10-1965 Referring Provider: Dr Wylene Simmer   Encounter Date: 09/17/2017  PT End of Session - 09/17/17 0805    Visit Number  12    Number of Visits  12    Date for PT Re-Evaluation  09/04/17    PT Start Time  0800    PT Stop Time  0845    PT Time Calculation (min)  45 min    Activity Tolerance  Patient tolerated treatment well       Past Medical History:  Diagnosis Date  . High risk HPV infection 10/13   neg pap, +HR HPV, 16/18 genotype neg  . LGSIL (low grade squamous intraepithelial dysplasia)    12/14 HPV HR not detected  . Stress fracture     Past Surgical History:  Procedure Laterality Date  . COLPOSCOPY    . HTA (ablation)  08/10/04  . REFRACTIVE SURGERY  2003  . TUBAL LIGATION     BTSP    There were no vitals filed for this visit.      [facility] PT Assessment - 09/17/17 0001      Assessment   Medical Diagnosis  Bimallolar fx, Rt ankle     Referring Provider  Dr Wylene Simmer    Onset Date/Surgical Date  05/03/17    Hand Dominance  Right    Next MD Visit  PRN       Observation/Other Assessments   Focus on Therapeutic Outcomes (FOTO)   20% limitation       Sensation   Additional Comments  WFL's       AROM   Right Ankle Dorsiflexion  18    Right Ankle Plantar Flexion  52    Right Ankle Inversion  35    Right Ankle Eversion  28    Left Ankle Dorsiflexion  12    Left Ankle Plantar Flexion  62    Left Ankle Inversion  46    Left Ankle Eversion  30      PROM   Right Ankle Dorsiflexion  19    Left Ankle Dorsiflexion  19      Strength   Right Ankle Dorsiflexion  5/5    Right Ankle Plantar Flexion  --   5-/5    Right Ankle Inversion  5/5    Right Ankle Eversion  5/5    Left Ankle Dorsiflexion  5/5     Left Ankle Plantar Flexion  5/5    Left Ankle Inversion  5/5    Left Ankle Eversion  5/5      Flexibility   Hamstrings  WFL's     Quadriceps  WFL's     ITB  WFL's     Piriformis  WFL's       Palpation   Palpation comment  palpable tenderness Rt lateral and medial ankle - screws palpable laterally       Ambulation/Gait   Gait Comments  good improvement in gait pattern with no limp with normal gait speed - note some limp with faster pace                    [facility] Adult PT Treatment/Exercise - 09/17/17 0001      Knee/Hip Exercises: Stretches   Gastroc Stretch  Right;Left;2 reps;20 seconds  Soleus Stretch  Right;Left;3 reps;30 seconds      Iontophoresis   Type of Iontophoresis  Dexamethasone    Location  Rt medial ankle    Dose  120 mAmp    Time  12 hours       Vasopneumatic   Number Minutes Vasopneumatic   15 minutes    Vasopnuematic Location   Ankle   Rt   Vasopneumatic Pressure  Medium    Vasopneumatic Temperature   34 deg      Manual Therapy   Passive ROM  PROM/stretch Rt ankle DF/PF/INV/EVE 2-3 reps each LE       Ankle Exercises: Aerobic   Stationary Bike  L3 x 6 min pillow for support    PTA present to discuss progress            PT Education - 09/17/17 0827    Education Details  encouraged pt to continue with HEP     Person(s) Educated  Patient    Methods  Explanation    Comprehension  Verbalized understanding          PT Long Term Goals - 09/17/17 0805      PT LONG TERM GOAL #1   Title  Increase ROM Rt ankle by 5-8 degrees in all planes 09/04/17    Time  6    Period  Weeks    Status  Achieved      PT LONG TERM GOAL #2   Title  Improve gait pattern with patient to demonstrate normal gait pattern for functional distances on level surfaces and ascend/descend steps safely and without difficulty 09/04/17    Time  6    Period  Weeks    Status  Achieved      PT LONG TERM GOAL #3   Title  Patient reports confidence in travel and  vacation plans for trip in August 09/04/17    Time  6    Period  Weeks    Status  Achieved      PT LONG TERM GOAL #4   Title  Independent in HEP 09/04/17    Time  6    Period  Weeks    Status  Achieved      PT LONG TERM GOAL #5   Title  Improve FOTO to </= 33% limitation 09/04/17    Time  6    Period  Weeks    Status  Achieved            Plan - 09/17/17 0829    Clinical Impression Statement  Excellent progress with ankle rehab. Patient reports no pain but some continued stiffness in the mornings. She feels confident in continuing with independent HEP and plans to return to her exercises class today - with modifications as needed. Nicole Carlson has accomplished goals of therapy.     Rehab Potential  Good    PT Frequency  2x / week    PT Duration  6 weeks    PT Treatment/Interventions  Patient/family education;ADLs/Self Care Home Management;Cryotherapy;Electrical Stimulation;Iontophoresis 30m/ml Dexamethasone;Moist Heat;Ultrasound;Dry needling;Manual techniques;Neuromuscular re-education;Gait training;Stair training;Functional mobility training;Therapeutic activities;Therapeutic exercise;Balance training    PT Next Visit Plan  d/c to HEP    Consulted and Agree with Plan of Care  Patient       Patient will benefit from skilled therapeutic intervention in order to improve the following deficits and impairments:  Postural dysfunction, Improper body mechanics, Pain, Increased fascial restricitons, Increased muscle spasms, Decreased strength, Decreased range of motion, Decreased mobility, Decreased  activity tolerance, Abnormal gait, Decreased balance  Visit Diagnosis: Pain in right ankle and joints of right foot  Muscle weakness (generalized)  Other abnormalities of gait and mobility  Stiffness of left ankle, not elsewhere classified     Problem List Patient Active Problem List   Diagnosis Date Noted  . METATARSALGIA 07/19/2008  . HAMMER TOE, ACQUIRED 07/19/2008  . ANKLE SPRAIN,  RIGHT 07/19/2008  . KNEE PAIN, LEFT 06/02/2008  . ILIOTIBIAL BAND SYNDROME 06/02/2008    Nicole Carlson Nilda Simmer PT, MPH  09/17/2017, 8:35 AM  [facility] [place], [place], [address] Phone: [phone]   Fax:  [phone]  Name: Nicole Carlson MRN: [id number] Date of Birth: Mar 29, 1965  PHYSICAL THERAPY DISCHARGE SUMMARY  Visits from Start of Care: 13  Current functional level related to goals / functional outcomes: See progress note/discharge svisit note for discharge status    Remaining deficits: Morning stiffness   Education / Equipment: HEP Plan: Patient agrees to discharge.  Patient goals were met. Patient is being discharged due to meeting the stated rehab goals.  ?????    Travarus Trudo P. Helene Kelp PT, MPH 09/17/17 8:36 AM

## 2018-02-10 DIAGNOSIS — M545 Low back pain: Secondary | ICD-10-CM | POA: Diagnosis not present

## 2018-02-10 DIAGNOSIS — M9903 Segmental and somatic dysfunction of lumbar region: Secondary | ICD-10-CM | POA: Diagnosis not present

## 2018-02-10 DIAGNOSIS — R293 Abnormal posture: Secondary | ICD-10-CM | POA: Diagnosis not present

## 2018-02-10 DIAGNOSIS — M2560 Stiffness of unspecified joint, not elsewhere classified: Secondary | ICD-10-CM | POA: Diagnosis not present

## 2018-02-14 DIAGNOSIS — M2560 Stiffness of unspecified joint, not elsewhere classified: Secondary | ICD-10-CM | POA: Diagnosis not present

## 2018-02-14 DIAGNOSIS — M545 Low back pain: Secondary | ICD-10-CM | POA: Diagnosis not present

## 2018-02-14 DIAGNOSIS — M9903 Segmental and somatic dysfunction of lumbar region: Secondary | ICD-10-CM | POA: Diagnosis not present

## 2018-02-14 DIAGNOSIS — R293 Abnormal posture: Secondary | ICD-10-CM | POA: Diagnosis not present

## 2018-02-20 DIAGNOSIS — L820 Inflamed seborrheic keratosis: Secondary | ICD-10-CM | POA: Diagnosis not present

## 2018-02-20 DIAGNOSIS — L814 Other melanin hyperpigmentation: Secondary | ICD-10-CM | POA: Diagnosis not present

## 2018-03-27 ENCOUNTER — Other Ambulatory Visit: Payer: Self-pay | Admitting: Certified Nurse Midwife

## 2018-03-27 DIAGNOSIS — Z1231 Encounter for screening mammogram for malignant neoplasm of breast: Secondary | ICD-10-CM

## 2018-04-04 ENCOUNTER — Ambulatory Visit
Admission: RE | Admit: 2018-04-04 | Discharge: 2018-04-04 | Disposition: A | Discharge status: Home / Self Care | Payer: BLUE CROSS/BLUE SHIELD | Source: Ambulatory Visit

## 2018-04-04 DIAGNOSIS — Z1231 Encounter for screening mammogram for malignant neoplasm of breast: Secondary | ICD-10-CM

## 2018-04-09 ENCOUNTER — Other Ambulatory Visit: Payer: Self-pay

## 2018-04-09 ENCOUNTER — Ambulatory Visit (INDEPENDENT_AMBULATORY_CARE_PROVIDER_SITE_OTHER): Payer: BLUE CROSS/BLUE SHIELD | Admitting: Certified Nurse Midwife

## 2018-04-09 ENCOUNTER — Encounter: Payer: Self-pay | Admitting: Certified Nurse Midwife

## 2018-04-09 ENCOUNTER — Other Ambulatory Visit (HOSPITAL_COMMUNITY)
Admission: RE | Admit: 2018-04-09 | Discharge: 2018-04-09 | Disposition: A | Discharge status: Home / Self Care | Payer: BLUE CROSS/BLUE SHIELD | Source: Ambulatory Visit | Attending: Certified Nurse Midwife | Admitting: Certified Nurse Midwife

## 2018-04-09 VITALS — BP 112/62 | HR 68 | Resp 16 | Ht 63.75 in | Wt 122.0 lb

## 2018-04-09 DIAGNOSIS — R899 Unspecified abnormal finding in specimens from other organs, systems and tissues: Secondary | ICD-10-CM

## 2018-04-09 DIAGNOSIS — B009 Herpesviral infection, unspecified: Secondary | ICD-10-CM | POA: Diagnosis not present

## 2018-04-09 DIAGNOSIS — Z01419 Encounter for gynecological examination (general) (routine) without abnormal findings: Secondary | ICD-10-CM | POA: Diagnosis not present

## 2018-04-09 DIAGNOSIS — Z0000 Encounter for general adult medical examination without abnormal findings: Secondary | ICD-10-CM | POA: Diagnosis not present

## 2018-04-09 DIAGNOSIS — Z124 Encounter for screening for malignant neoplasm of cervix: Secondary | ICD-10-CM

## 2018-04-09 DIAGNOSIS — E559 Vitamin D deficiency, unspecified: Secondary | ICD-10-CM | POA: Diagnosis not present

## 2018-04-09 MED ORDER — VALACYCLOVIR HCL 500 MG PO TABS: ORAL_TABLET | ORAL | 12 refills | Status: DC

## 2018-04-09 NOTE — Progress Notes (Signed)
53 y.o. [id number] Married  Caucasian Fe here for annual exam. Menopausal denies vaginal bleeding or vaginal dryness. Occasional hot flush, no issues. Had right foot fracture the night before she was to leave on cruise after last exam. Missed her cruise! Suppose to leave this [place] not sure will go. Having hard stools at times due to less activity, no blood in stools or black tarry stools. No other health concerns. Sees Urgent care if needed.  No LMP recorded. Patient has had an ablation.          Sexually active: Yes.    The current method of family planning is tubal ligation.    Exercising: Yes.    walking, weights Smoker:  no  Review of Systems  Constitutional: Negative.   HENT: Negative.   Eyes: Negative.   Respiratory: Negative.   Cardiovascular: Negative.   Gastrointestinal: Positive for constipation.  Genitourinary: Negative.   Musculoskeletal:       Muscle/joint pain  Skin: Negative.   Neurological: Negative.   Endo/Heme/Allergies: Negative.   Psychiatric/Behavioral: Negative.     Health Maintenance: Pap:  03-20-16 neg, 03-02-17 ASCUS HPV HR neg History of Abnormal Pap: yes MMG:  04-04-2018 category b density birads 1:neg Self Breast exams: yes Colonoscopy:  12/16 f/u 81yrs BMD:   none TDaP:  2019 Shingles: no Pneumonia: no Hep C and HIV: both neg 2017 Labs: if needed   reports that she has never smoked. She has never used smokeless tobacco. She reports that she does not drink alcohol or use drugs.  Past Medical History:  Diagnosis Date  . High risk HPV infection 10/13   neg pap, +HR HPV, 16/18 genotype neg  . LGSIL (low grade squamous intraepithelial dysplasia)    12/14 HPV HR not detected  . Stress fracture     Past Surgical History:  Procedure Laterality Date  . COLPOSCOPY    . HTA (ablation)  08/10/04  . REFRACTIVE SURGERY  2003  . TUBAL LIGATION     BTSP    Current Outpatient Medications  Medication Sig Dispense Refill  . Biotin w/  Vitamins C & E (HAIR/SKIN/NAILS PO) Take by mouth.    Marland Kitchen CALCIUM PO Take by mouth daily.    . Multiple Vitamins-Minerals (MULTIVITAMIN PO) Take by mouth daily.    . Probiotic Product (PROBIOTIC PO) Take by mouth.    . valACYclovir (VALTREX) 500 MG tablet 4 tablets po q 12 hours x 2 doses 30 tablet 12   No current facility-administered medications for this visit.     Family History  Problem Relation Age of Onset  . Aneurysm Mother   . Hypertension Mother   . Thyroid disease Mother   . Hypertension Sister   . Cancer Maternal Uncle        lung    ROS:  Pertinent items are noted in HPI.  Otherwise, a comprehensive ROS was negative.  Exam:   There were no vitals taken for this visit.   Ht Readings from Last 3 Encounters:  03/21/17 5' 3.75" (1.619 m)  03/20/16 5' 3.5" (1.613 m)  03/17/15 5' 3.75" (1.619 m)    General appearance: alert, cooperative and appears stated age Head: Normocephalic, without obvious abnormality, atraumatic Neck: no adenopathy, supple, symmetrical, trachea midline and thyroid normal to inspection and palpation Lungs: clear to auscultation bilaterally Breasts: normal appearance, no masses or tenderness, No nipple retraction or dimpling, No nipple discharge or bleeding, No axillary or supraclavicular adenopathy Heart: regular rate and rhythm  Abdomen: soft, non-tender; no masses,  no organomegaly Extremities: extremities normal, atraumatic, no cyanosis or edema Skin: Skin color, texture, turgor normal. No rashes or lesions Lymph nodes: Cervical, supraclavicular, and axillary nodes normal. No abnormal inguinal nodes palpated Neurologic: Grossly normal   Pelvic: External genitalia:  no lesions              Urethra:  normal appearing urethra with no masses, tenderness or lesions              Bartholin's and Skene's: normal                 Vagina: normal appearing vagina with normal color and discharge, no lesions              Cervix: no cervical motion  tenderness, no lesions and no bleeding from pap              Pap taken: Yes.   Bimanual Exam:  Uterus:  normal size, contour, position, consistency, mobility, non-tender and mid position              Adnexa: normal adnexa and no mass, fullness, tenderness               Rectovaginal: Confirms               Anus:  normal sphincter tone, no lesions  Chaperone present: yes  A:  Well Woman with normal exam  Menopausal no HRT history of ablation  Right foot fracture healing now from fall  Constipation  ASCUS pap follow up today  Screening labs  P:   Reviewed health and wellness pertinent to exam  Aware of need to advise if vaginal bleeding  Continue follow up as indicated  Discussed  Increase fluids/roughage in diet. Start on Colace OTC per instructions, this should help resolve with increasing activity. Warning signs with constipation given.  Labs: CBC, TSH, Lipid panel, CMP, Vitamin D  Pap smear: yes   counseled on breast self exam, mammography screening, feminine hygiene, menopause, adequate intake of calcium and vitamin D, diet and exercise. Discussed risk with the flu and Crovid 9 virus and travel. Patient aware and will take precautions.  return annually or prn  An After Visit Summary was printed and given to the patient.

## 2018-04-09 NOTE — Patient Instructions (Signed)

## 2018-04-10 LAB — COMPREHENSIVE METABOLIC PANEL
A/G RATIO: 2.1 (ref 1.2–2.2)
ALT: 18 IU/L (ref 0–32)
AST: 22 IU/L (ref 0–40)
Albumin: 4.6 g/dL (ref 3.8–4.9)
Alkaline Phosphatase: 70 IU/L (ref 39–117)
BILIRUBIN TOTAL: 0.5 mg/dL (ref 0.0–1.2)
BUN/Creatinine Ratio: 24 — ABNORMAL HIGH (ref 9–23)
BUN: 21 mg/dL (ref 6–24)
CHLORIDE: 102 mmol/L (ref 96–106)
CO2: 26 mmol/L (ref 20–29)
Calcium: 10 mg/dL (ref 8.7–10.2)
Creatinine, Ser: 0.89 mg/dL (ref 0.57–1.00)
GFR, EST AFRICAN AMERICAN: 85 mL/min/{1.73_m2} (ref >59)
GFR, EST NON AFRICAN AMERICAN: 74 mL/min/{1.73_m2} (ref >59)
GLOBULIN, TOTAL: 2.2 g/dL (ref 1.5–4.5)
Glucose: 84 mg/dL (ref 65–99)
POTASSIUM: 4.9 mmol/L (ref 3.5–5.2)
SODIUM: 144 mmol/L (ref 134–144)
TOTAL PROTEIN: 6.8 g/dL (ref 6.0–8.5)

## 2018-04-10 LAB — CBC
HEMATOCRIT: 42.5 % (ref 34.0–46.6)
Hemoglobin: 13.8 g/dL (ref 11.1–15.9)
MCH: 27.1 pg (ref 26.6–33.0)
MCHC: 32.5 g/dL (ref 31.5–35.7)
MCV: 84 fL (ref 79–97)
Platelets: 230 10*3/uL (ref 150–450)
RBC: 5.09 x10E6/uL (ref 3.77–5.28)
RDW: 12.5 % (ref 11.7–15.4)
WBC: 5 10*3/uL (ref 3.4–10.8)

## 2018-04-10 LAB — LIPID PANEL
CHOL/HDL RATIO: 3.2 ratio (ref 0.0–4.4)
Cholesterol, Total: 204 mg/dL — ABNORMAL HIGH (ref 100–199)
HDL: 63 mg/dL (ref >39)
LDL Calculated: 120 mg/dL — ABNORMAL HIGH (ref 0–99)
Triglycerides: 103 mg/dL (ref 0–149)
VLDL Cholesterol Cal: 21 mg/dL (ref 5–40)

## 2018-04-10 LAB — TSH: TSH: 0.736 u[IU]/mL (ref 0.450–4.500)

## 2018-04-10 LAB — VITAMIN D 25 HYDROXY (VIT D DEFICIENCY, FRACTURES): Vit D, 25-Hydroxy: 37.4 ng/mL (ref 30.0–100.0)

## 2018-04-11 LAB — CYTOLOGY - PAP
Diagnosis: NEGATIVE
HPV (WINDOPATH): NOT DETECTED

## 2018-11-06 DIAGNOSIS — I781 Nevus, non-neoplastic: Secondary | ICD-10-CM | POA: Diagnosis not present

## 2018-11-06 DIAGNOSIS — D225 Melanocytic nevi of trunk: Secondary | ICD-10-CM | POA: Diagnosis not present

## 2018-11-06 DIAGNOSIS — L814 Other melanin hyperpigmentation: Secondary | ICD-10-CM | POA: Diagnosis not present

## 2018-11-06 DIAGNOSIS — L820 Inflamed seborrheic keratosis: Secondary | ICD-10-CM | POA: Diagnosis not present

## 2018-11-06 DIAGNOSIS — L821 Other seborrheic keratosis: Secondary | ICD-10-CM | POA: Diagnosis not present

## 2018-11-06 DIAGNOSIS — L570 Actinic keratosis: Secondary | ICD-10-CM | POA: Diagnosis not present

## 2019-01-22 ENCOUNTER — Other Ambulatory Visit: Payer: BLUE CROSS/BLUE SHIELD

## 2019-01-22 DIAGNOSIS — Z20828 Contact with and (suspected) exposure to other viral communicable diseases: Secondary | ICD-10-CM | POA: Diagnosis not present

## 2019-02-23 DIAGNOSIS — M79641 Pain in right hand: Secondary | ICD-10-CM | POA: Diagnosis not present

## 2019-02-23 DIAGNOSIS — Z7689 Persons encountering health services in other specified circumstances: Secondary | ICD-10-CM | POA: Diagnosis not present

## 2019-02-23 DIAGNOSIS — Z6821 Body mass index (BMI) 21.0-21.9, adult: Secondary | ICD-10-CM | POA: Diagnosis not present

## 2019-02-23 DIAGNOSIS — Z0000 Encounter for general adult medical examination without abnormal findings: Secondary | ICD-10-CM | POA: Diagnosis not present

## 2019-04-06 ENCOUNTER — Ambulatory Visit
Admission: RE | Admit: 2019-04-06 | Discharge: 2019-04-06 | Disposition: A | Discharge status: Home / Self Care | Payer: BC Managed Care – PPO | Source: Ambulatory Visit

## 2019-04-06 ENCOUNTER — Other Ambulatory Visit: Payer: Self-pay

## 2019-04-06 ENCOUNTER — Other Ambulatory Visit: Payer: Self-pay | Admitting: Certified Nurse Midwife

## 2019-04-06 DIAGNOSIS — Z1231 Encounter for screening mammogram for malignant neoplasm of breast: Secondary | ICD-10-CM

## 2019-04-07 NOTE — Progress Notes (Signed)
Mammogram reviewed negative  Density B  Repeat yearly with 3 D

## 2019-04-14 ENCOUNTER — Encounter: Payer: Self-pay | Admitting: Certified Nurse Midwife

## 2019-04-14 ENCOUNTER — Ambulatory Visit (INDEPENDENT_AMBULATORY_CARE_PROVIDER_SITE_OTHER): Payer: BC Managed Care – PPO | Admitting: Certified Nurse Midwife

## 2019-04-14 ENCOUNTER — Other Ambulatory Visit: Payer: Self-pay

## 2019-04-14 VITALS — BP 100/62 | HR 64 | Temp 97.5°F | Resp 16 | Ht 63.5 in | Wt 124.0 lb

## 2019-04-14 DIAGNOSIS — Z01419 Encounter for gynecological examination (general) (routine) without abnormal findings: Secondary | ICD-10-CM | POA: Diagnosis not present

## 2019-04-14 DIAGNOSIS — B009 Herpesviral infection, unspecified: Secondary | ICD-10-CM | POA: Diagnosis not present

## 2019-04-14 MED ORDER — VALACYCLOVIR HCL 500 MG PO TABS: ORAL_TABLET | ORAL | 12 refills | Status: DC

## 2019-04-14 NOTE — Patient Instructions (Signed)

## 2019-04-14 NOTE — Progress Notes (Signed)
54 y.o. [id number] Married  Caucasian Fe here for annual exam. Menopausal no HRT. Denies vaginal bleeding or vaginal dryness. Established with  [facility] recently with labs and exam. Working on diet and exercise. Spouse and patient both working from home now. Needs Valtrex update for HSV, minimal outbreaks now. No health issues today.  No LMP recorded. Patient has had an ablation.          Sexually active: Yes.    The current method of family planning is tubal ligation.    Exercising: Yes.    running, walking & yoga 4 times a week Smoker:  no  Review of Systems  Constitutional: Negative.   HENT: Negative.   Eyes: Negative.   Respiratory: Negative.   Cardiovascular: Negative.   Gastrointestinal: Negative.   Genitourinary: Negative.   Musculoskeletal: Negative.   Skin: Negative.   Neurological: Negative.   Endo/Heme/Allergies: Negative.   Psychiatric/Behavioral: Negative.     Health Maintenance: Pap:  03-02-17 ASCUS HPV HR neg, 04-09-2018 neg HPV HR neg History of Abnormal Pap: yes MMG:  04-07-2019 category b density birads 1:neg Self Breast exams: yes Colonoscopy:  12/16 f/u 48yrs BMD:   none TDaP:  2019 Shingles: 2020 Pneumonia: no Hep C and HIV: both neg 2017 Labs: PCP    reports that she has never smoked. She has never used smokeless tobacco. She reports that she does not drink alcohol or use drugs.  Past Medical History:  Diagnosis Date  . Broken ankle    right  . High risk HPV infection 10/13   neg pap, +HR HPV, 16/18 genotype neg  . LGSIL (low grade squamous intraepithelial dysplasia)    12/14 HPV HR not detected  . Stress fracture     Past Surgical History:  Procedure Laterality Date  . ANKLE SURGERY     times 2, right ankle  . COLPOSCOPY    . HTA (ablation)  08/10/04  . REFRACTIVE SURGERY  2003  . TUBAL LIGATION     BTSP    Current Outpatient Medications  Medication Sig Dispense Refill  . Biotin w/ Vitamins C & E (HAIR/SKIN/NAILS PO)  Take by mouth.    . Multiple Vitamins-Minerals (MULTIVITAMIN PO) Take by mouth daily.    . valACYclovir (VALTREX) 500 MG tablet 4 tablets po q 12 hours x 2 doses 30 tablet 12   No current facility-administered medications for this visit.    Family History  Problem Relation Age of Onset  . Aneurysm Mother   . Hypertension Mother   . Thyroid disease Mother   . Hypertension Sister   . Cancer Maternal Uncle        lung    ROS:  Pertinent items are noted in HPI.  Otherwise, a comprehensive ROS was negative.  Exam:   BP 100/62   Pulse 64   Temp (!) 97.5 F (36.4 C) (Skin)   Resp 16   Ht 5' 3.5" (1.613 m)   Wt 124 lb (56.2 kg)   BMI 21.62 kg/m  Height: 5' 3.5" (161.3 cm) Ht Readings from Last 3 Encounters:  04/14/19 5' 3.5" (1.613 m)  04/09/18 5' 3.75" (1.619 m)  03/21/17 5' 3.75" (1.619 m)    General appearance: alert, cooperative and appears stated age Head: Normocephalic, without obvious abnormality, atraumatic Neck: no adenopathy, supple, symmetrical, trachea midline and thyroid normal to inspection and palpation Lungs: clear to auscultation bilaterally Breasts: normal appearance, no masses or tenderness, No nipple retraction or dimpling, No nipple discharge or  bleeding, No axillary or supraclavicular adenopathy Heart: regular rate and rhythm Abdomen: soft, non-tender; no masses,  no organomegaly Extremities: extremities normal, atraumatic, no cyanosis or edema Skin: Skin color, texture, turgor normal. No rashes or lesions Lymph nodes: Cervical, supraclavicular, and axillary nodes normal. No abnormal inguinal nodes palpated Neurologic: Grossly normal   Pelvic: External genitalia:  no lesions              Urethra:  normal appearing urethra with no masses, tenderness or lesions              Bartholin's and Skene's: normal                 Vagina: normal appearing vagina with normal color and discharge, no lesions              Cervix: no cervical motion tenderness, no  lesions and normal appearance              Pap taken: No. Bimanual Exam:  Uterus:  normal size, contour, position, consistency, mobility, non-tender and anteverted              Adnexa: normal adnexa and no mass, fullness, tenderness               Rectovaginal: Confirms               Anus:  normal sphincter tone, no lesions  Chaperone present: yes  A:  Well Woman with normal exam  Menopausal no HRT  Recent aex with elevated cholesterol working on diet and exercise  History of HSV  P:   Reviewed health and wellness pertinent to exam   Aware of need to advise if vaginal bleeding  Follow up with MD as indicated  Rx Valtrex see order with instructions  Pap smear: no   counseled on breast self exam, mammography screening, feminine hygiene, menopause, adequate intake of calcium and vitamin D, diet and exercise  return annually or prn  An After Visit Summary was printed and given to the patient.

## 2019-04-20 ENCOUNTER — Encounter: Payer: Self-pay | Admitting: Certified Nurse Midwife

## 2019-05-06 DIAGNOSIS — Z23 Encounter for immunization: Secondary | ICD-10-CM | POA: Diagnosis not present

## 2019-09-21 DIAGNOSIS — Z0184 Encounter for antibody response examination: Secondary | ICD-10-CM | POA: Diagnosis not present

## 2019-09-24 DIAGNOSIS — M25511 Pain in right shoulder: Secondary | ICD-10-CM | POA: Diagnosis not present

## 2019-09-24 DIAGNOSIS — G8321 Monoplegia of upper limb affecting right dominant side: Secondary | ICD-10-CM | POA: Diagnosis not present

## 2019-09-24 DIAGNOSIS — R29898 Other symptoms and signs involving the musculoskeletal system: Secondary | ICD-10-CM | POA: Insufficient documentation

## 2019-10-12 DIAGNOSIS — G5601 Carpal tunnel syndrome, right upper limb: Secondary | ICD-10-CM | POA: Diagnosis not present

## 2019-10-28 DIAGNOSIS — M25511 Pain in right shoulder: Secondary | ICD-10-CM | POA: Diagnosis not present

## 2019-10-28 DIAGNOSIS — G8321 Monoplegia of upper limb affecting right dominant side: Secondary | ICD-10-CM | POA: Diagnosis not present

## 2019-10-28 DIAGNOSIS — M79641 Pain in right hand: Secondary | ICD-10-CM | POA: Diagnosis not present

## 2020-01-25 DIAGNOSIS — L821 Other seborrheic keratosis: Secondary | ICD-10-CM | POA: Diagnosis not present

## 2020-01-25 DIAGNOSIS — I781 Nevus, non-neoplastic: Secondary | ICD-10-CM | POA: Diagnosis not present

## 2020-01-25 DIAGNOSIS — L720 Epidermal cyst: Secondary | ICD-10-CM | POA: Diagnosis not present

## 2020-01-25 DIAGNOSIS — L814 Other melanin hyperpigmentation: Secondary | ICD-10-CM | POA: Diagnosis not present

## 2020-01-25 DIAGNOSIS — L570 Actinic keratosis: Secondary | ICD-10-CM | POA: Diagnosis not present

## 2020-03-08 ENCOUNTER — Other Ambulatory Visit: Payer: Self-pay | Admitting: Obstetrics and Gynecology

## 2020-03-08 DIAGNOSIS — Z1231 Encounter for screening mammogram for malignant neoplasm of breast: Secondary | ICD-10-CM

## 2020-04-07 DIAGNOSIS — Z1231 Encounter for screening mammogram for malignant neoplasm of breast: Secondary | ICD-10-CM

## 2020-04-13 ENCOUNTER — Ambulatory Visit
Admission: RE | Admit: 2020-04-13 | Discharge: 2020-04-13 | Disposition: A | Discharge status: Home / Self Care | Payer: BC Managed Care – PPO | Source: Ambulatory Visit | Attending: Obstetrics and Gynecology | Admitting: Obstetrics and Gynecology

## 2020-04-13 ENCOUNTER — Other Ambulatory Visit: Payer: Self-pay

## 2020-04-13 DIAGNOSIS — Z1231 Encounter for screening mammogram for malignant neoplasm of breast: Secondary | ICD-10-CM

## 2020-04-21 ENCOUNTER — Ambulatory Visit: Payer: BC Managed Care – PPO | Admitting: Obstetrics and Gynecology

## 2020-05-24 NOTE — Progress Notes (Signed)
55 y.o. [id number] Married White or Caucasian Not Hispanic or Latino female here for annual exam. Patient states that she has had a hard time sleeping. She hasn't slept well since she broke her ankle 3 years ago. Also has 2 elderly dogs that need to go out at night. She hasn't tried OTC agents for sleep. No vasomotor symptoms. No dyspareunia.   Her best friend died in 2023/01/13, died one month after diagnosis of colon cancer.     No LMP recorded. Patient has had an ablation.          Sexually active: Yes.    The current method of family planning is post menopausal status.    Exercising: Yes.    walking  Smoker:  no  Health Maintenance: Pap:  03-02-17 ASCUS HPV HR neg, 04-09-2018 neg HPV HR neg History of abnormal Pap:  Yes colpo 2015, h/o LSIL MMG:  04/14/20 density B Bi-rads 1 neg  BMD:   NA Colonoscopy: 12/2014 10 f/u TDaP:  2019  Gardasil: NA   reports that she has never smoked. She has never used smokeless tobacco. She reports that she does not drink alcohol and does not use drugs. 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 She has 2 kids, daughter and son. Her husband has 4 sons. Her daughter is the only one who is married. Her daughter has a 72 month son (in [place]).   Past Medical History:  Diagnosis Date  . Broken ankle    right  . High risk HPV infection 10/13   neg pap, +HR HPV, 16/18 genotype neg  . LGSIL (low grade squamous intraepithelial dysplasia)    12/14 HPV HR not detected  . Stress fracture     Past Surgical History:  Procedure Laterality Date  . ANKLE SURGERY     times 2, right ankle  . COLPOSCOPY    . HTA (ablation)  08/10/04  . REFRACTIVE SURGERY  2003  . TUBAL LIGATION     BTSP    Current Outpatient Medications  Medication Sig Dispense Refill  . Biotin w/ Vitamins C & E (HAIR/SKIN/NAILS PO) Take by mouth.    . Multiple Vitamins-Minerals (MULTIVITAMIN PO) Take by mouth daily.    Marland Kitchen UNABLE TO FIND Microfactor vitamin packet containing a  multivitamin CoQ 10 antioxidant pro biotic efa    . valACYclovir (VALTREX) 500 MG tablet 4 tablets po q 12 hours x 2 doses 30 tablet 12   No current facility-administered medications for this visit.    Family History  Problem Relation Age of Onset  . Aneurysm Mother   . Hypertension Mother   . Thyroid disease Mother   . Hypertension Sister   . Cancer Maternal Uncle        lung    Review of Systems  Psychiatric/Behavioral: Positive for sleep disturbance.  All other systems reviewed and are negative.   Exam:   BP 100/62   Pulse 68   Ht 5\' 4"  (1.626 m)   Wt 120 lb (54.4 kg)   SpO2 100%   BMI 20.60 kg/m   Weight change: @WEIGHTCHANGE @ Height:   Height: 5\' 4"  (162.6 cm)  Ht Readings from Last 3 Encounters:  05/26/20 5\' 4"  (1.626 m)  04/14/19 5' 3.5" (1.613 m)  04/09/18 5' 3.75" (1.619 m)    General appearance: alert, cooperative and appears stated age Head: Normocephalic, without obvious abnormality, atraumatic Neck: no adenopathy, supple, symmetrical, trachea midline and thyroid normal to inspection and palpation  Lungs: clear to auscultation bilaterally Cardiovascular: regular rate and rhythm Breasts: normal appearance, no masses or tenderness, right nipple inverted (patient states new in the last 6 months) Abdomen: soft, non-tender; non distended,  no masses,  no organomegaly Extremities: extremities normal, atraumatic, no cyanosis or edema Skin: Skin color, texture, turgor normal. No rashes or lesions Lymph nodes: Cervical, supraclavicular, and axillary nodes normal. No abnormal inguinal nodes palpated Neurologic: Grossly normal   Pelvic: External genitalia:  no lesions              Urethra:  normal appearing urethra with no masses, tenderness or lesions              Bartholins and Skenes: normal                 Vagina: normal appearing vagina with normal color and discharge, no lesions              Cervix: no lesions               Bimanual Exam:  Uterus:  normal  size, contour, position, consistency, mobility, non-tender              Adnexa: no mass, fullness, tenderness               Rectovaginal: Confirms              Anus:  normal sphincter tone, no lesions  Carolynn Serve chaperoned for the exam.  1. Well woman exam Discussed breast self exam Discussed calcium and vit D intake Mammogram and colonoscopy are UTD  2. Sleep disturbance Try melatonin Work on sleep habits  3. Herpes simplex - valACYclovir (VALTREX) 500 MG tablet; 4 tablets po q 12 hours x 2 doses  Dispense: 30 tablet; Refill: 12  4. Laboratory exam ordered as part of routine general medical examination - CBC - Comprehensive metabolic panel - Lipid panel  5. Inversion of right nipple, occurred in the last 6 months Recent normal mammogram, other than the inversion her breast exam is normal Will set up diagnostic mammogram and ultrasound on the right

## 2020-05-26 ENCOUNTER — Other Ambulatory Visit: Payer: Self-pay

## 2020-05-26 ENCOUNTER — Ambulatory Visit (INDEPENDENT_AMBULATORY_CARE_PROVIDER_SITE_OTHER): Payer: BC Managed Care – PPO | Admitting: Obstetrics and Gynecology

## 2020-05-26 ENCOUNTER — Telehealth: Payer: Self-pay | Admitting: Obstetrics and Gynecology

## 2020-05-26 ENCOUNTER — Encounter: Payer: Self-pay | Admitting: Obstetrics and Gynecology

## 2020-05-26 VITALS — BP 100/62 | HR 68 | Ht 64.0 in | Wt 120.0 lb

## 2020-05-26 DIAGNOSIS — N6459 Other signs and symptoms in breast: Secondary | ICD-10-CM

## 2020-05-26 DIAGNOSIS — R799 Abnormal finding of blood chemistry, unspecified: Secondary | ICD-10-CM

## 2020-05-26 DIAGNOSIS — G479 Sleep disorder, unspecified: Secondary | ICD-10-CM

## 2020-05-26 DIAGNOSIS — Z0000 Encounter for general adult medical examination without abnormal findings: Secondary | ICD-10-CM | POA: Diagnosis not present

## 2020-05-26 DIAGNOSIS — B009 Herpesviral infection, unspecified: Secondary | ICD-10-CM | POA: Diagnosis not present

## 2020-05-26 DIAGNOSIS — Z01419 Encounter for gynecological examination (general) (routine) without abnormal findings: Secondary | ICD-10-CM

## 2020-05-26 LAB — CBC
MCH: 26.8 pg — ABNORMAL LOW (ref 27.0–33.0)
MPV: 10.7 fL (ref 7.5–12.5)
Platelets: 231 10*3/uL (ref 140–400)

## 2020-05-26 MED ORDER — VALACYCLOVIR HCL 500 MG PO TABS: ORAL_TABLET | ORAL | 12 refills | Status: DC

## 2020-05-26 NOTE — Patient Instructions (Addendum)
EXERCISE   We recommended that you start or continue a regular exercise program for good health. Physical activity is anything that gets your body moving, some is better than none. The CDC recommends 150 minutes per week of Moderate-Intensity Aerobic Activity and 2 or more days of Muscle Strengthening Activity.  Benefits of exercise are limitless: helps weight loss/weight maintenance, improves mood and energy, helps with depression and anxiety, improves sleep, tones and strengthens muscles, improves balance, improves bone density, protects from chronic conditions such as heart disease, high blood pressure and diabetes and so much more. To learn more visit: https://www.cdc.gov/physicalactivity/index.html  DIET: Good nutrition starts with a healthy diet of fruits, vegetables, whole grains, and lean protein sources. Drink plenty of water for hydration. Minimize empty calories, sodium, sweets. For more information about dietary recommendations visit: https://health.gov/our-work/nutrition-physical-activity/dietary-guidelines and https://www.myplate.gov/  ALCOHOL:  Women should limit their alcohol intake to no more than 7 drinks/beers/glasses of wine (combined, not each!) per week. Moderation of alcohol intake to this level decreases your risk of breast cancer and liver damage.  If you are concerned that you may have a problem, or your friends have told you they are concerned about your drinking, there are many resources to help. A well-known program that is free, effective, and available to all people all over the nation is Alcoholics Anonymous.  Check out this site to learn more: https://www.aa.org/   CALCIUM AND VITAMIN D:  Adequate intake of calcium and Vitamin D are recommended for bone health.  You should be getting between 1000-1200 mg of calcium and 800 units of Vitamin D daily between diet and supplements  PAP SMEARS:  Pap smears, to check for cervical cancer or precancers,  have traditionally been  done yearly, scientific advances have shown that most women can have pap smears less often.  However, every woman still should have a physical exam from her gynecologist every year. It will include a breast check, inspection of the vulva and vagina to check for abnormal growths or skin changes, a visual exam of the cervix, and then an exam to evaluate the size and shape of the uterus and ovaries. We will also provide age appropriate advice regarding health maintenance, like when you should have certain vaccines, screening for sexually transmitted diseases, bone density testing, colonoscopy, mammograms, etc.   MAMMOGRAMS:  All women over 40 years old should have a routine mammogram.   COLON CANCER SCREENING: Now recommend starting at age 45. At this time colonoscopy is not covered for routine screening until 50. There are take home tests that can be done between 45-49.   COLONOSCOPY:  Colonoscopy to screen for colon cancer is recommended for all women at age 50.  We know, you hate the idea of the prep.  We agree, BUT, having colon cancer and not knowing it is worse!!  Colon cancer so often starts as a polyp that can be seen and removed at colonscopy, which can quite literally save your life!  And if your first colonoscopy is normal and you have no family history of colon cancer, most women don't have to have it again for 10 years.  Once every ten years, you can do something that may end up saving your life, right?  We will be happy to help you get it scheduled when you are ready.  Be sure to check your insurance coverage so you understand how much it will cost.  It may be covered as a preventative service at no cost, but you should check   your particular policy.      Breast Self-Awareness Breast self-awareness means being familiar with how your breasts look and feel. It involves checking your breasts regularly and reporting any changes to your health care provider. Practicing breast self-awareness is  important. A change in your breasts can be a sign of a serious medical problem. Being familiar with how your breasts look and feel allows you to find any problems early, when treatment is more likely to be successful. All women should practice breast self-awareness, including women who have had breast implants. How to do a breast self-exam One way to learn what is normal for your breasts and whether your breasts are changing is to do a breast self-exam. To do a breast self-exam: Look for Changes  1. Remove all the clothing above your waist. 2. Stand in front of a mirror in a room with good lighting. 3. Put your hands on your hips. 4. Push your hands firmly downward. 5. Compare your breasts in the mirror. Look for differences between them (asymmetry), such as: ? Differences in shape. ? Differences in size. ? Puckers, dips, and bumps in one breast and not the other. 6. Look at each breast for changes in your skin, such as: ? Redness. ? Scaly areas. 7. Look for changes in your nipples, such as: ? Discharge. ? Bleeding. ? Dimpling. ? Redness. ? A change in position. Feel for Changes Carefully feel your breasts for lumps and changes. It is best to do this while lying on your back on the floor and again while sitting or standing in the shower or tub with soapy water on your skin. Feel each breast in the following way:  Place the arm on the side of the breast you are examining above your head.  Feel your breast with the other hand.  Start in the nipple area and make  inch (2 cm) overlapping circles to feel your breast. Use the pads of your three middle fingers to do this. Apply light pressure, then medium pressure, then firm pressure. The light pressure will allow you to feel the tissue closest to the skin. The medium pressure will allow you to feel the tissue that is a little deeper. The firm pressure will allow you to feel the tissue close to the ribs.  Continue the overlapping circles,  moving downward over the breast until you feel your ribs below your breast.  Move one finger-width toward the center of the body. Continue to use the  inch (2 cm) overlapping circles to feel your breast as you move slowly up toward your collarbone.  Continue the up and down exam using all three pressures until you reach your armpit.  Write Down What You Find  Write down what is normal for each breast and any changes that you find. Keep a written record with breast changes or normal findings for each breast. By writing this information down, you do not need to depend only on memory for size, tenderness, or location. Write down where you are in your menstrual cycle, if you are still menstruating. If you are having trouble noticing differences in your breasts, do not get discouraged. With time you will become more familiar with the variations in your breasts and more comfortable with the exam. How often should I examine my breasts? Examine your breasts every month. If you are breastfeeding, the best time to examine your breasts is after a feeding or after using a breast pump. If you menstruate, the best time to   examine your breasts is 5-7 days after your period is over. During your period, your breasts are lumpier, and it may be more difficult to notice changes. When should I see my health care provider? See your health care provider if you notice:  A change in shape or size of your breasts or nipples.  A change in the skin of your breast or nipples, such as a reddened or scaly area.  Unusual discharge from your nipples.  A lump or thick area that was not there before.  Pain in your breasts.  Anything that concerns you.  Insomnia Insomnia is a sleep disorder that makes it difficult to fall asleep or stay asleep. Insomnia can cause fatigue, low energy, difficulty concentrating, mood swings, and poor performance at work or school. There are three different ways to classify  insomnia:  Difficulty falling asleep.  Difficulty staying asleep.  Waking up too early in the morning. Any type of insomnia can be long-term (chronic) or short-term (acute). Both are common. Short-term insomnia usually lasts for three months or less. Chronic insomnia occurs at least three times a week for longer than three months. What are the causes? Insomnia may be caused by another condition, situation, or substance, such as:  Anxiety.  Certain medicines.  Gastroesophageal reflux disease (GERD) or other gastrointestinal conditions.  Asthma or other breathing conditions.  Restless legs syndrome, sleep apnea, or other sleep disorders.  Chronic pain.  Menopause.  Stroke.  Abuse of alcohol, tobacco, or illegal drugs.  Mental health conditions, such as depression.  Caffeine.  Neurological disorders, such as Alzheimer's disease.  An overactive thyroid (hyperthyroidism). Sometimes, the cause of insomnia may not be known. What increases the risk? Risk factors for insomnia include:  Gender. Women are affected more often than men.  Age. Insomnia is more common as you get older.  Stress.  Lack of exercise.  Irregular work schedule or working night shifts.  Traveling between different time zones.  Certain medical and mental health conditions. What are the signs or symptoms? If you have insomnia, the main symptom is having trouble falling asleep or having trouble staying asleep. This may lead to other symptoms, such as:  Feeling fatigued or having low energy.  Feeling nervous about going to sleep.  Not feeling rested in the morning.  Having trouble concentrating.  Feeling irritable, anxious, or depressed. How is this diagnosed? This condition may be diagnosed based on:  Your symptoms and medical history. Your health care provider may ask about: ? Your sleep habits. ? Any medical conditions you have. ? Your mental health.  A physical exam. How is this  treated? Treatment for insomnia depends on the cause. Treatment may focus on treating an underlying condition that is causing insomnia. Treatment may also include:  Medicines to help you sleep.  Counseling or therapy.  Lifestyle adjustments to help you sleep better. Follow these instructions at home: Eating and drinking  Limit or avoid alcohol, caffeinated beverages, and cigarettes, especially close to bedtime. These can disrupt your sleep.  Do not eat a large meal or eat spicy foods right before bedtime. This can lead to digestive discomfort that can make it hard for you to sleep.   Sleep habits  Keep a sleep diary to help you and your health care provider figure out what could be causing your insomnia. Write down: ? When you sleep. ? When you wake up during the night. ? How well you sleep. ? How rested you feel the next day. ?  Any side effects of medicines you are taking. ? What you eat and drink.  Make your bedroom a dark, comfortable place where it is easy to fall asleep. ? Put up shades or blackout curtains to block light from outside. ? Use a white noise machine to block noise. ? Keep the temperature cool.  Limit screen use before bedtime. This includes: ? Watching TV. ? Using your smartphone, tablet, or computer.  Stick to a routine that includes going to bed and waking up at the same times every day and night. This can help you fall asleep faster. Consider making a quiet activity, such as reading, part of your nighttime routine.  Try to avoid taking naps during the day so that you sleep better at night.  Get out of bed if you are still awake after 15 minutes of trying to sleep. Keep the lights down, but try reading or doing a quiet activity. When you feel sleepy, go back to bed.   General instructions  Take over-the-counter and prescription medicines only as told by your health care provider.  Exercise regularly, as told by your health care provider. Avoid exercise  starting several hours before bedtime.  Use relaxation techniques to manage stress. Ask your health care provider to suggest some techniques that may work well for you. These may include: ? Breathing exercises. ? Routines to release muscle tension. ? Visualizing peaceful scenes.  Make sure that you drive carefully. Avoid driving if you feel very sleepy.  Keep all follow-up visits as told by your health care provider. This is important. Contact a health care provider if:  You are tired throughout the day.  You have trouble in your daily routine due to sleepiness.  You continue to have sleep problems, or your sleep problems get worse. Get help right away if:  You have serious thoughts about hurting yourself or someone else. If you ever feel like you may hurt yourself or others, or have thoughts about taking your own life, get help right away. You can go to your nearest emergency department or call:  Your local emergency services (911 in the [place]  A suicide crisis helpline, such as the National Suicide Prevention Lifeline at 782-245-3006. This is open 24 hours a day. Summary  Insomnia is a sleep disorder that makes it difficult to fall asleep or stay asleep.  Insomnia can be long-term (chronic) or short-term (acute).  Treatment for insomnia depends on the cause. Treatment may focus on treating an underlying condition that is causing insomnia.  Keep a sleep diary to help you and your health care provider figure out what could be causing your insomnia. This information is not intended to replace advice given to you by your health care provider. Make sure you discuss any questions you have with your health care provider. Document Revised: 11/26/2019 Document Reviewed: 11/26/2019 Elsevier Patient Education  2021 [employer].

## 2020-05-26 NOTE — Telephone Encounter (Signed)
mychart message sent that breast imaging is being scheduled

## 2020-05-27 LAB — CBC
HCT: 43.6 % (ref 35.0–45.0)
Hemoglobin: 13.7 g/dL (ref 11.7–15.5)
MCHC: 31.4 g/dL — ABNORMAL LOW (ref 32.0–36.0)
MCV: 85.2 fL (ref 80.0–100.0)
RBC: 5.12 10*6/uL — ABNORMAL HIGH (ref 3.80–5.10)
RDW: 12.4 % (ref 11.0–15.0)
WBC: 6.2 10*3/uL (ref 3.8–10.8)

## 2020-05-27 LAB — COMPREHENSIVE METABOLIC PANEL
AG Ratio: 2.1 (calc) (ref 1.0–2.5)
ALT: 22 U/L (ref 6–29)
AST: 25 U/L (ref 10–35)
Albumin: 4.6 g/dL (ref 3.6–5.1)
Alkaline phosphatase (APISO): 65 U/L (ref 37–153)
BUN/Creatinine Ratio: 43 (calc) — ABNORMAL HIGH (ref 6–22)
BUN: 34 mg/dL — ABNORMAL HIGH (ref 7–25)
CO2: 31 mmol/L (ref 20–32)
Calcium: 9.8 mg/dL (ref 8.6–10.4)
Chloride: 103 mmol/L (ref 98–110)
Creat: 0.79 mg/dL (ref 0.50–1.05)
Globulin: 2.2 g/dL (calc) (ref 1.9–3.7)
Glucose, Bld: 84 mg/dL (ref 65–99)
Potassium: 4.4 mmol/L (ref 3.5–5.3)
Sodium: 141 mmol/L (ref 135–146)
Total Bilirubin: 0.4 mg/dL (ref 0.2–1.2)
Total Protein: 6.8 g/dL (ref 6.1–8.1)

## 2020-05-27 LAB — LIPID PANEL
Cholesterol: 165 mg/dL (ref <200)
HDL: 55 mg/dL (ref >=50)
LDL Cholesterol (Calc): 94 mg/dL (calc)
Non-HDL Cholesterol (Calc): 110 mg/dL (calc) (ref <130)
Total CHOL/HDL Ratio: 3 (calc) (ref <5.0)
Triglycerides: 74 mg/dL (ref <150)

## 2020-05-27 NOTE — Addendum Note (Signed)
Addended by: Aura Camps on: 05/27/2020 08:38 AM   Modules accepted: Orders

## 2020-05-27 NOTE — Telephone Encounter (Signed)
Per Dr.Jertson staff message "This patient has new onset right nipple inversion in the last 6 months. She just had a mammogram last month, but I think she needs a diagnostic right breast mammogram and ultrasound. Please schedule.  Thanks,  Noreene Larsson

## 2020-05-30 NOTE — Addendum Note (Signed)
Addended by: Aura Camps on: 05/30/2020 08:18 AM   Modules accepted: Orders

## 2020-06-06 ENCOUNTER — Other Ambulatory Visit: Payer: Self-pay

## 2020-06-06 ENCOUNTER — Other Ambulatory Visit: Payer: BC Managed Care – PPO

## 2020-06-06 DIAGNOSIS — R799 Abnormal finding of blood chemistry, unspecified: Secondary | ICD-10-CM

## 2020-06-07 LAB — RENAL FUNCTION PANEL
Albumin: 4.2 g/dL (ref 3.6–5.1)
BUN/Creatinine Ratio: 45 (calc) — ABNORMAL HIGH (ref 6–22)
BUN: 36 mg/dL — ABNORMAL HIGH (ref 7–25)
CO2: 30 mmol/L (ref 20–32)
Calcium: 9.5 mg/dL (ref 8.6–10.4)
Chloride: 99 mmol/L (ref 98–110)
Creat: 0.8 mg/dL (ref 0.50–1.05)
Glucose, Bld: 87 mg/dL (ref 65–99)
Phosphorus: 4.1 mg/dL (ref 2.5–4.5)
Potassium: 4.3 mmol/L (ref 3.5–5.3)
Sodium: 137 mmol/L (ref 135–146)

## 2020-06-10 ENCOUNTER — Other Ambulatory Visit: Payer: Self-pay

## 2020-06-10 DIAGNOSIS — R7989 Other specified abnormal findings of blood chemistry: Secondary | ICD-10-CM

## 2020-06-10 DIAGNOSIS — R799 Abnormal finding of blood chemistry, unspecified: Secondary | ICD-10-CM

## 2020-06-22 ENCOUNTER — Other Ambulatory Visit: Payer: BC Managed Care – PPO

## 2020-06-22 ENCOUNTER — Other Ambulatory Visit: Payer: Self-pay

## 2020-06-22 DIAGNOSIS — R799 Abnormal finding of blood chemistry, unspecified: Secondary | ICD-10-CM

## 2020-06-22 LAB — RENAL PROFILE WITH ESTIMATED GFR
Albumin: 4.2 g/dL (ref 3.6–5.1)
BUN/Creatinine Ratio: 46 (calc) — ABNORMAL HIGH (ref 6–22)
BUN: 38 mg/dL — ABNORMAL HIGH (ref 7–25)
CO2: 30 mmol/L (ref 20–32)
Calcium: 9.6 mg/dL (ref 8.6–10.4)
Chloride: 102 mmol/L (ref 98–110)
Creat: 0.83 mg/dL (ref 0.50–1.05)
GFR, Est African American: 91 mL/min/{1.73_m2} (ref >=60)
GFR, Est Non African American: 79 mL/min/{1.73_m2} (ref >=60)
Glucose, Bld: 103 mg/dL — ABNORMAL HIGH (ref 65–99)
Phosphorus: 4.5 mg/dL (ref 2.5–4.5)
Potassium: 4 mmol/L (ref 3.5–5.3)
Sodium: 140 mmol/L (ref 135–146)

## 2020-06-28 ENCOUNTER — Ambulatory Visit
Admission: RE | Admit: 2020-06-28 | Discharge: 2020-06-28 | Disposition: A | Discharge status: Home / Self Care | Payer: BC Managed Care – PPO | Source: Ambulatory Visit | Attending: Obstetrics and Gynecology | Admitting: Obstetrics and Gynecology

## 2020-06-28 ENCOUNTER — Other Ambulatory Visit: Payer: Self-pay

## 2020-06-28 DIAGNOSIS — N6459 Other signs and symptoms in breast: Secondary | ICD-10-CM

## 2020-07-01 ENCOUNTER — Other Ambulatory Visit: Payer: BC Managed Care – PPO

## 2021-02-01 DIAGNOSIS — L570 Actinic keratosis: Secondary | ICD-10-CM | POA: Diagnosis not present

## 2021-02-01 DIAGNOSIS — L281 Prurigo nodularis: Secondary | ICD-10-CM | POA: Diagnosis not present

## 2021-02-01 DIAGNOSIS — L720 Epidermal cyst: Secondary | ICD-10-CM | POA: Diagnosis not present

## 2021-02-01 DIAGNOSIS — L988 Other specified disorders of the skin and subcutaneous tissue: Secondary | ICD-10-CM | POA: Diagnosis not present

## 2021-02-01 DIAGNOSIS — L820 Inflamed seborrheic keratosis: Secondary | ICD-10-CM | POA: Diagnosis not present

## 2021-02-01 DIAGNOSIS — L821 Other seborrheic keratosis: Secondary | ICD-10-CM | POA: Diagnosis not present

## 2021-02-21 DIAGNOSIS — H7292 Unspecified perforation of tympanic membrane, left ear: Secondary | ICD-10-CM | POA: Diagnosis not present

## 2021-02-21 DIAGNOSIS — H66012 Acute suppurative otitis media with spontaneous rupture of ear drum, left ear: Secondary | ICD-10-CM | POA: Diagnosis not present

## 2021-02-21 DIAGNOSIS — Z1231 Encounter for screening mammogram for malignant neoplasm of breast: Secondary | ICD-10-CM | POA: Diagnosis not present

## 2021-02-28 ENCOUNTER — Other Ambulatory Visit: Payer: Self-pay | Admitting: Obstetrics and Gynecology

## 2021-02-28 DIAGNOSIS — Z1231 Encounter for screening mammogram for malignant neoplasm of breast: Secondary | ICD-10-CM

## 2021-03-30 DIAGNOSIS — H7202 Central perforation of tympanic membrane, left ear: Secondary | ICD-10-CM | POA: Diagnosis not present

## 2021-04-06 DIAGNOSIS — M79646 Pain in unspecified finger(s): Secondary | ICD-10-CM | POA: Diagnosis not present

## 2021-04-06 DIAGNOSIS — Z13820 Encounter for screening for osteoporosis: Secondary | ICD-10-CM | POA: Diagnosis not present

## 2021-04-06 DIAGNOSIS — Z6822 Body mass index (BMI) 22.0-22.9, adult: Secondary | ICD-10-CM | POA: Diagnosis not present

## 2021-04-06 DIAGNOSIS — Z0000 Encounter for general adult medical examination without abnormal findings: Secondary | ICD-10-CM | POA: Diagnosis not present

## 2021-04-10 DIAGNOSIS — M1811 Unilateral primary osteoarthritis of first carpometacarpal joint, right hand: Secondary | ICD-10-CM | POA: Diagnosis not present

## 2021-04-10 DIAGNOSIS — Z13220 Encounter for screening for lipoid disorders: Secondary | ICD-10-CM | POA: Diagnosis not present

## 2021-04-10 DIAGNOSIS — M79644 Pain in right finger(s): Secondary | ICD-10-CM | POA: Diagnosis not present

## 2021-04-10 DIAGNOSIS — Z0000 Encounter for general adult medical examination without abnormal findings: Secondary | ICD-10-CM | POA: Diagnosis not present

## 2021-04-12 DIAGNOSIS — L570 Actinic keratosis: Secondary | ICD-10-CM | POA: Diagnosis not present

## 2021-04-18 DIAGNOSIS — Z13820 Encounter for screening for osteoporosis: Secondary | ICD-10-CM | POA: Diagnosis not present

## 2021-04-24 ENCOUNTER — Ambulatory Visit
Admission: RE | Admit: 2021-04-24 | Discharge: 2021-04-24 | Disposition: A | Discharge status: Home / Self Care | Payer: BC Managed Care – PPO | Source: Ambulatory Visit

## 2021-04-24 ENCOUNTER — Other Ambulatory Visit: Payer: Self-pay

## 2021-04-24 DIAGNOSIS — Z1231 Encounter for screening mammogram for malignant neoplasm of breast: Secondary | ICD-10-CM | POA: Diagnosis not present

## 2021-05-24 DIAGNOSIS — L853 Xerosis cutis: Secondary | ICD-10-CM | POA: Diagnosis not present

## 2021-05-24 DIAGNOSIS — L570 Actinic keratosis: Secondary | ICD-10-CM | POA: Diagnosis not present

## 2021-06-14 NOTE — Progress Notes (Signed)
56 y.o. [id number] Married White or Caucasian Not Hispanic or Latino female here for annual exam.  No vaginal bleeding.   Nocturia 2-3 x a night, voids a large amounts. Advised to limit her intake.  She feels like she can not loose any weight. She is eating healthy, walks or runs 4 miles daily. She is drinking lots of water.  Recent TSH was normal.    Low libido. Recently she has started getting headaches just prior to orgasm. The pain lasts a few minutes. She isn't feeling sexy with her weight gain. Not getting headaches with exercise.    No LMP recorded. Patient has had an ablation.          Sexually active: Yes.    The current method of family planning is tubal ligation.    Exercising: Yes.     Walking  Smoker:  no  Health Maintenance: Pap:  04-09-2018 neg HPV HR neg, 03-02-17 ASCUS HPV HR neg History of abnormal Pap:  Yes colpo 2015, h/o LSIL MMG:  04/24/21 Density B Bi-rads 1 neg  BMD:   04/18/21 Normal ( care everywhere)  Colonoscopy: 12/2014 F/u 10 years  TDaP:  2019 Gardasil: na   reports that she has never smoked. She has never used smokeless tobacco. She reports that she does not drink alcohol and does not use drugs. 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 She has 2 kids, daughter and son. Her husband has 4 sons. Her daughter is the only one who is married. Her daughter has a 63.61 year old son (in [place]).   Past Medical History:  Diagnosis Date   Broken ankle    right   High risk HPV infection 10/13   neg pap, +HR HPV, 16/18 genotype neg   LGSIL (low grade squamous intraepithelial dysplasia)    12/14 HPV HR not detected   Stress fracture     Past Surgical History:  Procedure Laterality Date   ANKLE SURGERY     times 2, right ankle   COLPOSCOPY     HTA (ablation)  08/10/04   REFRACTIVE SURGERY  2003   TUBAL LIGATION     BTSP    Current Outpatient Medications  Medication Sig Dispense Refill   Cyanocobalamin (B-12 PO) Take by mouth.     UNABLE TO  FIND Microfactor vitamin packet containing a multivitamin CoQ 10 antioxidant pro biotic efa     valACYclovir (VALTREX) 500 MG tablet 4 tablets po q 12 hours x 2 doses 30 tablet 12   No current facility-administered medications for this visit.    Family History  Problem Relation Age of Onset   Aneurysm Mother    Hypertension Mother    Thyroid disease Mother    Hypertension Sister    Cancer Maternal Uncle        lung    Review of Systems  All other systems reviewed and are negative.  Exam:   BP 128/62   Pulse 72   Ht 5' 3.5" (1.613 m)   Wt 128 lb (58.1 kg)   SpO2 99%   BMI 22.32 kg/m   Weight change: @WEIGHTCHANGE @ Height:   Height: 5' 3.5" (161.3 cm)  Ht Readings from Last 3 Encounters:  06/22/21 5' 3.5" (1.613 m)  05/26/20 5\' 4"  (1.626 m)  04/14/19 5' 3.5" (1.613 m)    General appearance: alert, cooperative and appears stated age Head: Normocephalic, without obvious abnormality, atraumatic Neck: no adenopathy, supple, symmetrical, trachea midline and thyroid  normal to inspection and palpation Lungs: clear to auscultation bilaterally Cardiovascular: regular rate and rhythm Breasts: normal appearance, no masses or tenderness Abdomen: soft, non-tender; non distended,  no masses,  no organomegaly Extremities: extremities normal, atraumatic, no cyanosis or edema Skin: Skin color, texture, turgor normal. No rashes or lesions Lymph nodes: Cervical, supraclavicular, and axillary nodes normal. No abnormal inguinal nodes palpated Neurologic: Grossly normal   Pelvic: External genitalia:  no lesions              Urethra:  normal appearing urethra with no masses, tenderness or lesions              Bartholins and Skenes: normal                 Vagina: atrophic appearing vagina with normal color and discharge, no lesions              Cervix: no lesions               Bimanual Exam:  Uterus:  normal size, contour, position, consistency, mobility, non-tender              Adnexa:  no mass, fullness, tenderness               Rectovaginal: Confirms               Anus:  normal sphincter tone, no lesions  Carolynn Serve chaperoned for the exam.  1. Well woman exam Discussed breast self exam Discussed calcium and vit D intake Mammogram and colonoscopy UTD  2. Low libido Discussed libido, information given - Testos,Total,Free and SHBG (Female) -If testosterone is low, discussed the option of topical testosterone. Reviewed the risks and that it is not FDA approved.  3. Weight gain Discussed healthy diet and exercise. Normal TSH. Normal BMI  4. Screening for cervical cancer - Cytology - PAP

## 2021-06-22 ENCOUNTER — Other Ambulatory Visit (HOSPITAL_COMMUNITY)
Admission: RE | Admit: 2021-06-22 | Discharge: 2021-06-22 | Disposition: A | Discharge status: Home / Self Care | Payer: BC Managed Care – PPO | Source: Ambulatory Visit | Attending: Obstetrics and Gynecology | Admitting: Obstetrics and Gynecology

## 2021-06-22 ENCOUNTER — Ambulatory Visit (INDEPENDENT_AMBULATORY_CARE_PROVIDER_SITE_OTHER): Payer: BC Managed Care – PPO | Admitting: Obstetrics and Gynecology

## 2021-06-22 VITALS — BP 128/62 | HR 72 | Ht 63.5 in | Wt 128.0 lb

## 2021-06-22 DIAGNOSIS — R6882 Decreased libido: Secondary | ICD-10-CM

## 2021-06-22 DIAGNOSIS — Z124 Encounter for screening for malignant neoplasm of cervix: Secondary | ICD-10-CM | POA: Diagnosis not present

## 2021-06-22 DIAGNOSIS — R799 Abnormal finding of blood chemistry, unspecified: Secondary | ICD-10-CM | POA: Diagnosis not present

## 2021-06-22 DIAGNOSIS — R635 Abnormal weight gain: Secondary | ICD-10-CM

## 2021-06-22 DIAGNOSIS — Z01419 Encounter for gynecological examination (general) (routine) without abnormal findings: Secondary | ICD-10-CM

## 2021-06-28 LAB — CYTOLOGY - PAP
Comment: NEGATIVE
Diagnosis: NEGATIVE
High risk HPV: NEGATIVE

## 2021-06-28 LAB — TESTOS,TOTAL,FREE AND SHBG (FEMALE)
Free Testosterone: 0.7 pg/mL (ref 0.1–6.4)
Sex Hormone Binding: 56 nmol/L (ref 14–73)
Testosterone, Total, LC-MS-MS: 6 ng/dL (ref 2–45)

## 2021-06-29 ENCOUNTER — Other Ambulatory Visit: Payer: Self-pay

## 2021-06-29 DIAGNOSIS — R6882 Decreased libido: Secondary | ICD-10-CM

## 2021-06-29 MED ORDER — NONFORMULARY OR COMPOUNDED ITEM: 0 refills | Status: DC

## 2021-07-26 DIAGNOSIS — M25512 Pain in left shoulder: Secondary | ICD-10-CM | POA: Insufficient documentation

## 2021-08-04 ENCOUNTER — Other Ambulatory Visit: Payer: BC Managed Care – PPO

## 2021-08-04 DIAGNOSIS — R6882 Decreased libido: Secondary | ICD-10-CM

## 2021-08-07 LAB — TESTOS,TOTAL,FREE AND SHBG (FEMALE)
Free Testosterone: 19.7 pg/mL — ABNORMAL HIGH (ref 0.1–6.4)
Sex Hormone Binding: 40 nmol/L (ref 14–73)
Testosterone, Total, LC-MS-MS: 160 ng/dL — ABNORMAL HIGH (ref 2–45)

## 2021-08-08 ENCOUNTER — Encounter: Payer: Self-pay | Admitting: Obstetrics and Gynecology

## 2021-08-09 ENCOUNTER — Other Ambulatory Visit: Payer: Self-pay

## 2021-08-09 MED ORDER — NONFORMULARY OR COMPOUNDED ITEM: 0 refills | Status: DC

## 2021-08-15 ENCOUNTER — Other Ambulatory Visit: Payer: Self-pay

## 2021-08-15 DIAGNOSIS — R6882 Decreased libido: Secondary | ICD-10-CM

## 2021-08-18 ENCOUNTER — Telehealth: Payer: Self-pay | Admitting: *Deleted

## 2021-08-18 MED ORDER — NONFORMULARY OR COMPOUNDED ITEM: 0 refills | Status: DC

## 2021-08-18 NOTE — Telephone Encounter (Signed)
Patient called stating she picked up the testosterone 0.3 % Rx, reports it still ordered as 0.5 ml (clicks). Which is what she was previously using with 1% testosterone.  I called pharmacy to confirm Rx. Patient was dispensed testosterone 0.3% cream 0.5 mg to lower abdomen daily. I spoke with Leotis Shames and she explained   ( I re-read her Dr. Oscar La note on 08/04/21 result note "Please let the patient know that she is getting too much testosterone and reach out to the compounding pharmacy. She should reduce her dose to 1/3 of what she is currently getting, please ask them to make this adjustment. ""  Lauren said the 0.5 ml would be 1/3 reduced dose from the 1% cream patient was previously taking. So patient does have correct Rx for testsoterone 0.3 % 0.5 ml daily. I called patient to relay and she just spoke with pharmacist and they explained this to patient as well. I will correct the Rx in [facility] medication list as well.    I did not call refill in I just have to sign Rx so correct dose will be list on medication list.    Just FYI

## 2021-09-03 ENCOUNTER — Encounter: Payer: Self-pay | Admitting: Obstetrics and Gynecology

## 2021-09-04 NOTE — Telephone Encounter (Signed)
Dr.Jertson I routed the billing part of this message to [place].

## 2021-09-13 ENCOUNTER — Encounter: Payer: Self-pay | Admitting: Obstetrics and Gynecology

## 2021-09-13 NOTE — Telephone Encounter (Signed)
Dr.Jertson I just want to confirm you want to see her? She attached at picture of rash.

## 2021-09-14 NOTE — Telephone Encounter (Signed)
Yes, I should see her

## 2021-09-18 ENCOUNTER — Other Ambulatory Visit: Payer: BC Managed Care – PPO

## 2021-09-18 ENCOUNTER — Encounter: Payer: Self-pay | Admitting: Obstetrics and Gynecology

## 2021-09-18 ENCOUNTER — Ambulatory Visit (INDEPENDENT_AMBULATORY_CARE_PROVIDER_SITE_OTHER): Payer: BC Managed Care – PPO | Admitting: Obstetrics and Gynecology

## 2021-09-18 VITALS — BP 110/64 | HR 77 | Wt 131.0 lb

## 2021-09-18 DIAGNOSIS — R21 Rash and other nonspecific skin eruption: Secondary | ICD-10-CM | POA: Diagnosis not present

## 2021-09-18 DIAGNOSIS — L853 Xerosis cutis: Secondary | ICD-10-CM

## 2021-09-18 DIAGNOSIS — R6882 Decreased libido: Secondary | ICD-10-CM

## 2021-09-18 DIAGNOSIS — Z79890 Hormone replacement therapy (postmenopausal): Secondary | ICD-10-CM | POA: Diagnosis not present

## 2021-09-18 DIAGNOSIS — E281 Androgen excess: Secondary | ICD-10-CM | POA: Diagnosis not present

## 2021-09-18 DIAGNOSIS — K5900 Constipation, unspecified: Secondary | ICD-10-CM

## 2021-09-18 MED ORDER — BETAMETHASONE VALERATE 0.1 % EX OINT: 1.0000 | TOPICAL_OINTMENT | Freq: Two times a day (BID) | CUTANEOUS | 0 refills | Status: DC

## 2021-09-18 NOTE — Patient Instructions (Signed)
Rash, Adult A rash is a change in the color of your skin. A rash can also change the way your skin feels. There are many different conditions and factors that can cause a rash. Some rashes may disappear after a few days, but some may last for a few weeks. Common causes of rashes include: Viral infections, such as: Colds. Measles. Hand, foot, and mouth disease. Bacterial infections, such as: Scarlet fever. Impetigo. Fungal infections, such as Candida. Allergic reactions to food, medicines, or skin care products. Follow these instructions at home: The goal of treatment is to stop the itching and keep the rash from spreading. Pay attention to any changes in your symptoms. Follow these instructions to help with your condition: Medicine Take or apply over-the-counter and prescription medicines only as told by your health care provider. These may include: Corticosteroid creams to treat red or swollen skin. Anti-itch lotions. Oral allergy medicines (antihistamines). Oral corticosteroids for severe symptoms.  Skin care Apply cool compresses to the affected areas. Do not scratch or rub your skin. Avoid covering the rash. Make sure the rash is exposed to air as much as possible. Managing itching and discomfort Avoid hot showers or baths, which can make itching worse. A cold shower may help. Try taking a bath with: Epsom salts. Follow manufacturer instructions on the packaging. You can get these at your local pharmacy or grocery store. Baking soda. Pour a small amount into the bath as told by your health care provider. Colloidal oatmeal. Follow manufacturer instructions on the packaging. You can get this at your local pharmacy or grocery store. Try applying baking soda paste to your skin. Stir water into baking soda until it reaches a paste-like consistency. Try applying calamine lotion. This is an over-the-counter lotion that helps to relieve itchiness. Keep cool and out of the sun. Sweating  and being hot can make itching worse. General instructions  Rest as needed. Drink enough fluid to keep your urine pale yellow. Wear loose-fitting clothing. Avoid scented soaps, detergents, and perfumes. Use gentle soaps, detergents, perfumes, and other cosmetic products. Avoid any substance that causes your rash. Keep a journal to help track what causes your rash. Write down: What you eat. What cosmetic products you use. What you drink. What you wear. This includes jewelry. Keep all follow-up visits as told by your health care provider. This is important. Contact a health care provider if: You sweat at night. You lose weight. You urinate more than normal. You urinate less than normal, or you notice that your urine is a darker color than usual. You feel weak. You vomit. Your skin or the whites of your eyes look yellow (jaundice). Your skin: Tingles. Is numb. Your rash: Does not go away after several days. Gets worse. You are: Unusually thirsty. More tired than normal. You have: New symptoms. Pain in your abdomen. A fever. Diarrhea. Get help right away if you: Have a fever and your symptoms suddenly get worse. Develop confusion. Have a severe headache or a stiff neck. Have severe joint pains or stiffness. Have a seizure. Develop a rash that covers all or most of your body. The rash may or may not be painful. Develop blisters that: Are on top of the rash. Grow larger or grow together. Are painful. Are inside your nose or mouth. Develop a rash that: Looks like purple pinprick-sized spots all over your body. Has a "bull's eye" or looks like a target. Is not related to sun exposure, is red and painful, and causes   your skin to peel. Summary A rash is a change in the color of your skin. Some rashes disappear after a few days, but some may last for a few weeks. The goal of treatment is to stop the itching and keep the rash from spreading. Take or apply over-the-counter  and prescription medicines only as told by your health care provider. Contact a health care provider if you have new or worsening symptoms. Keep all follow-up visits as told by your health care provider. This is important. This information is not intended to replace advice given to you by your health care provider. Make sure you discuss any questions you have with your health care provider. Document Revised: 10/27/2020 Document Reviewed: 10/27/2020 Elsevier Patient Education  2023 [employer] Inc[employer]  

## 2021-09-18 NOTE — Progress Notes (Signed)
GYNECOLOGY  VISIT   HPI: 56 y.o.   Married White or Caucasian Not Hispanic or Latino  female   [phone] with No LMP recorded. Patient has had an ablation.   here for breast rash.  She reports an intermittent, dry, itchy rash on bilateral breast.   She also states that she is working out 45 min a day 5x week. She states that she has a healthy diet. She states that she is not loosing any weight.  Doesn't drink ETOH, only drinks water. Eats healthy. Exercising more.  She has constipation, dry skin, some hair loss. No fatigue.   GYNECOLOGIC HISTORY: No LMP recorded. Patient has had an ablation. Contraception: tubal ligation  Menopausal hormone therapy: testosterone        OB History     Gravida  3   Para  2   Term  2   Preterm      AB  1   Living  2      SAB  1   IAB      Ectopic      Multiple      Live Births  2              Patient Active Problem List   Diagnosis Date Noted   Pain in right hand 10/28/2019   Pain in joint of right shoulder 09/24/2019   Weakness of right arm 09/24/2019   Closed bimalleolar fracture of right ankle 05/06/2017   METATARSALGIA 07/19/2008   HAMMER TOE, ACQUIRED 07/19/2008   ANKLE SPRAIN, RIGHT 07/19/2008   KNEE PAIN, LEFT 06/02/2008   ILIOTIBIAL BAND SYNDROME 06/02/2008    Past Medical History:  Diagnosis Date   Broken ankle    right   High risk HPV infection 10/13   neg pap, +HR HPV, 16/18 genotype neg   LGSIL (low grade squamous intraepithelial dysplasia)    12/14 HPV HR not detected   Stress fracture     Past Surgical History:  Procedure Laterality Date   ANKLE SURGERY     times 2, right ankle   COLPOSCOPY     HTA (ablation)  08/10/04   REFRACTIVE SURGERY  2003   TUBAL LIGATION     BTSP    Current Outpatient Medications  Medication Sig Dispense Refill   Cyanocobalamin (B-12 PO) Take by mouth.     NONFORMULARY OR COMPOUNDED ITEM Testosterone 0.3 % cream S:  apply 0.5 mls to lower abdomen daily. 1 each 0    UNABLE TO FIND Microfactor vitamin packet containing a multivitamin CoQ 10 antioxidant pro biotic efa     valACYclovir (VALTREX) 500 MG tablet 4 tablets po q 12 hours x 2 doses 30 tablet 12   No current facility-administered medications for this visit.     ALLERGIES: Patient has no known allergies.  Family History  Problem Relation Age of Onset   Aneurysm Mother    Hypertension Mother    Thyroid disease Mother    Hypertension Sister    Cancer Maternal Uncle        lung    Social History   Socioeconomic History   Marital status: Married    Spouse name: Not on file   Number of children: Not on file   Years of education: Not on file   Highest education level: Not on file  Occupational History   Not on file  Tobacco Use   Smoking status: Never   Smokeless tobacco: Never  Substance and Sexual Activity  Alcohol use: No   Drug use: No   Sexual activity: Yes    Partners: Male    Birth control/protection: Surgical    Comment: BTSP  Other Topics Concern   Not on file  Social History Narrative   Not on file   Social Determinants of Health   Financial Resource Strain: Not on file  Food Insecurity: Not on file  Transportation Needs: Not on file  Physical Activity: Not on file  Stress: Not on file  Social Connections: Not on file  Intimate Partner Violence: Not on file    Review of Systems  All other systems reviewed and are negative.   PHYSICAL EXAMINATION:    BP 110/64   Pulse 77   Wt 131 lb (59.4 kg)   SpO2 99%   BMI 22.84 kg/m     General appearance: alert, cooperative and appears stated age Skin: she has a patchy, erythematous, slightly raised rash on her breasts and upper back.  1. Rash Possible eczema, not clear. Information given. She will focally try the steroid ointment and will f/u with Dermatology - betamethasone valerate ointment (VALISONE) 0.1 %; Apply 1 Application topically 2 (two) times daily.  Dispense: 30 g; Refill: 0  2. Constipation,  unspecified constipation type She can't loose weight despite an increase in exercise and a healthy diet. She also c/o dry skin, hair loss and constipation.  - TSH  3. Dry skin - TSH

## 2021-09-19 LAB — TSH: TSH: 0.75 mIU/L (ref 0.40–4.50)

## 2021-09-22 LAB — TESTOS,TOTAL,FREE AND SHBG (FEMALE)
Free Testosterone: 7 pg/mL — ABNORMAL HIGH (ref 0.1–6.4)
Sex Hormone Binding: 50 nmol/L (ref 14–73)
Testosterone, Total, LC-MS-MS: 73 ng/dL — ABNORMAL HIGH (ref 2–45)

## 2021-10-27 DIAGNOSIS — L280 Lichen simplex chronicus: Secondary | ICD-10-CM | POA: Diagnosis not present

## 2021-10-27 DIAGNOSIS — L570 Actinic keratosis: Secondary | ICD-10-CM | POA: Diagnosis not present

## 2021-11-17 IMAGING — MG DIGITAL SCREENING BILAT W/ TOMO W/ CAD
8 series · 8 of 24 positions shown · non-contrast
Comparison: Previous exam(s).

CLINICAL DATA: Screening.

EXAM:
DIGITAL SCREENING BILATERAL MAMMOGRAM WITH TOMO AND CAD

[L CC synth-2D]
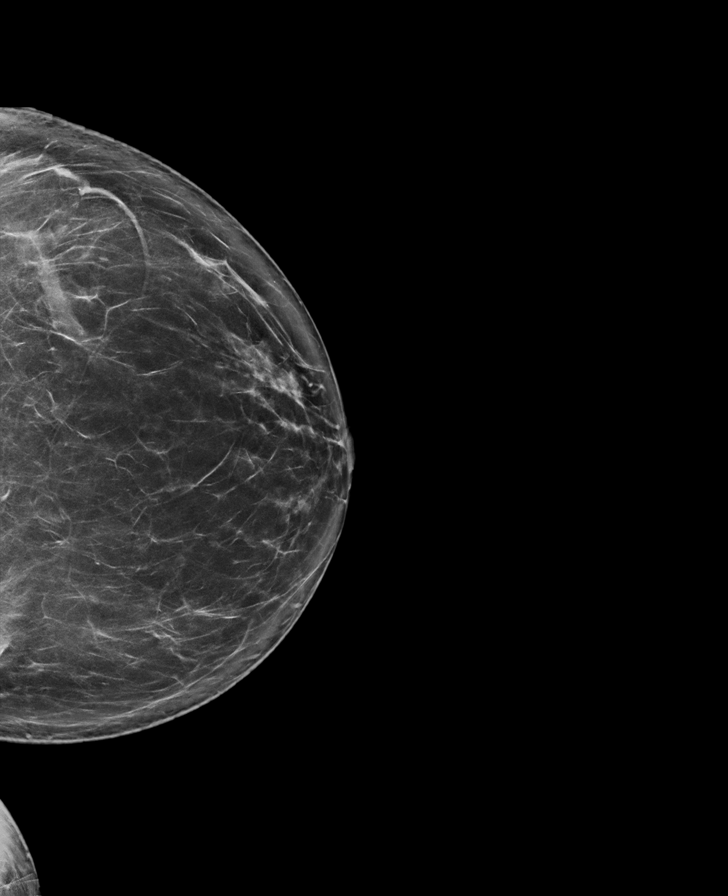

[R CC synth-2D]
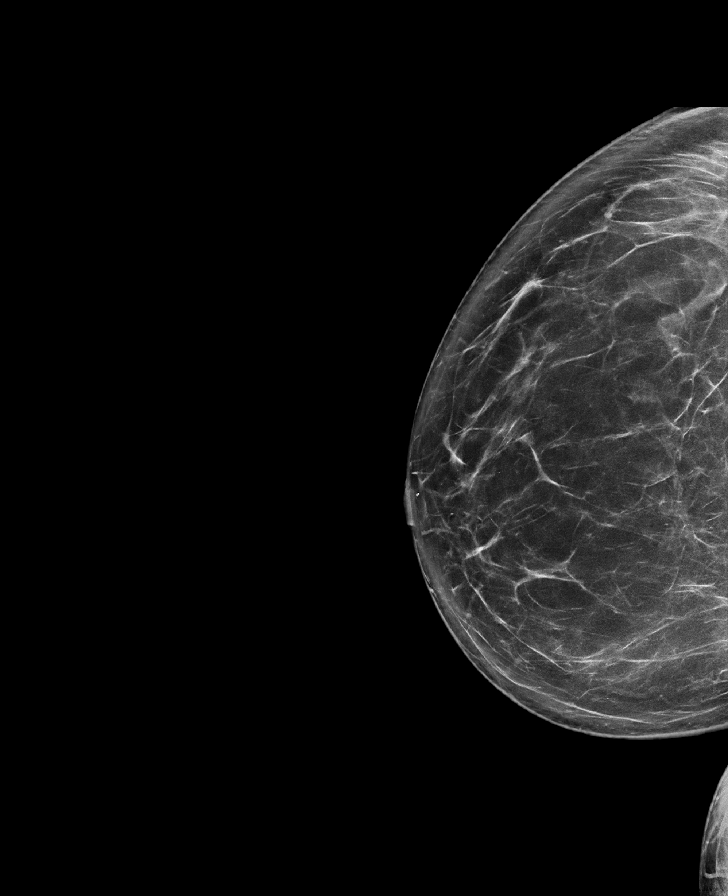

[L MLO synth-2D]
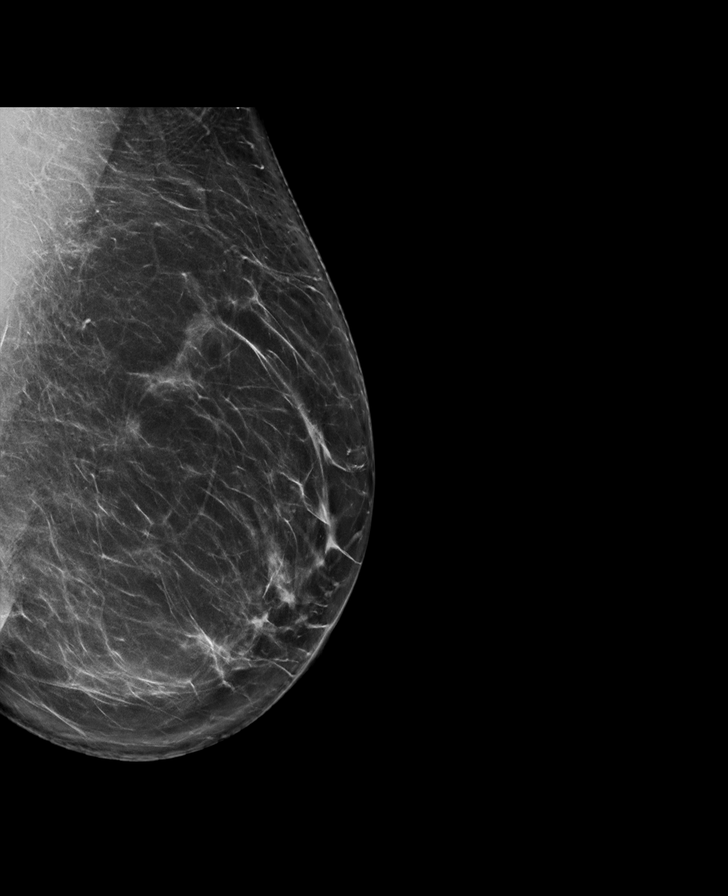

[R MLO synth-2D]
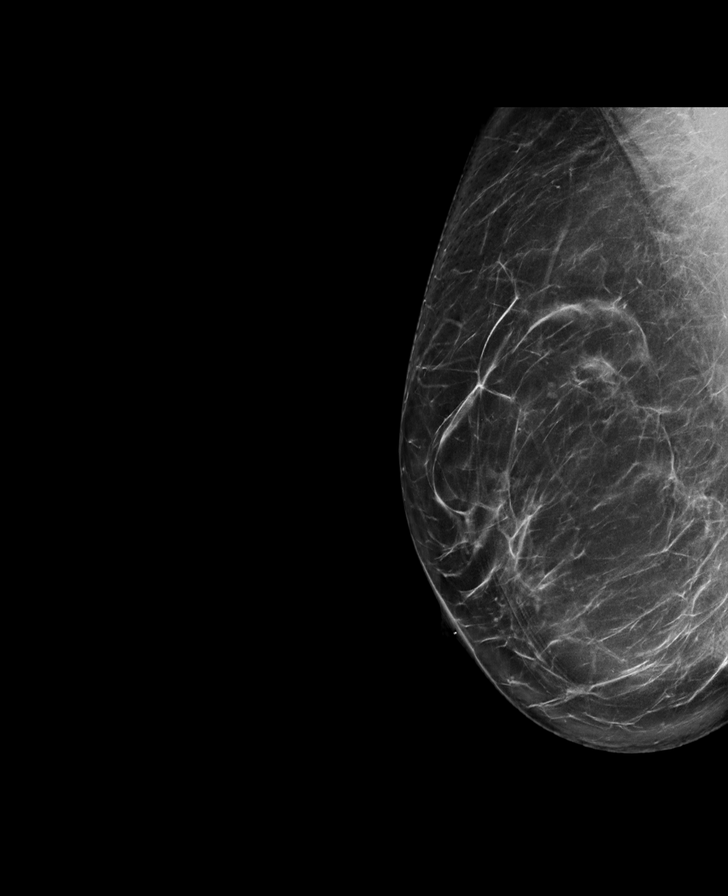

[L CC tomo · tomo slice 43/86.0]
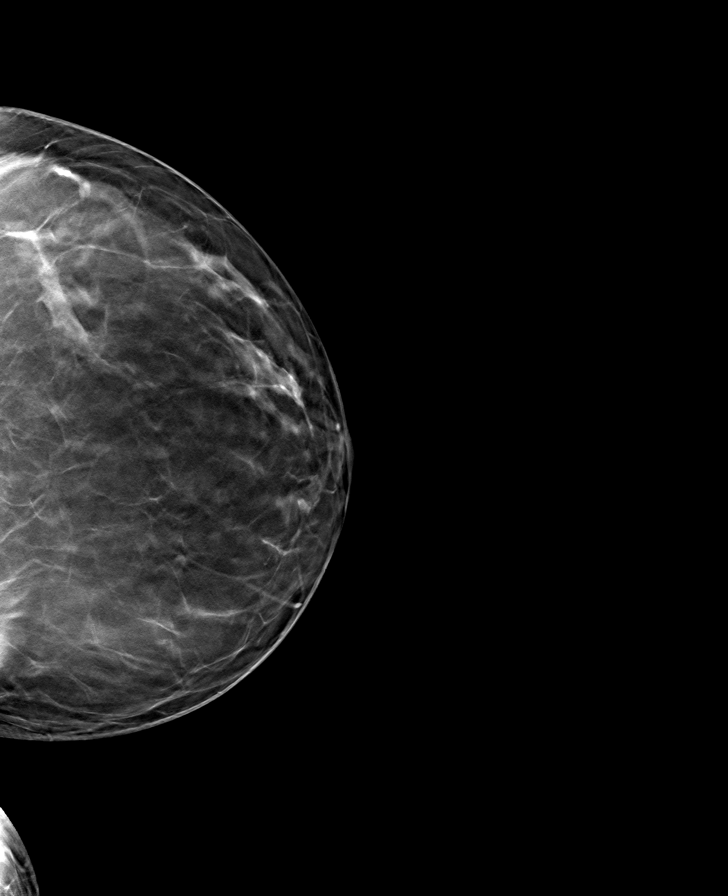

[R MLO tomo · tomo slice 45/88.0]
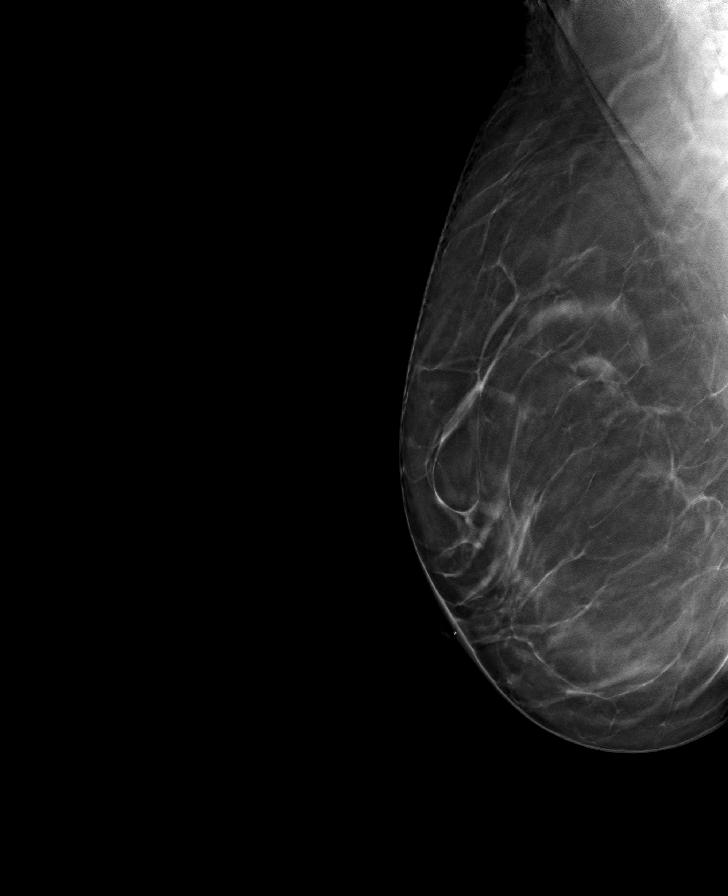

[L MLO tomo · tomo slice 45/89.0]
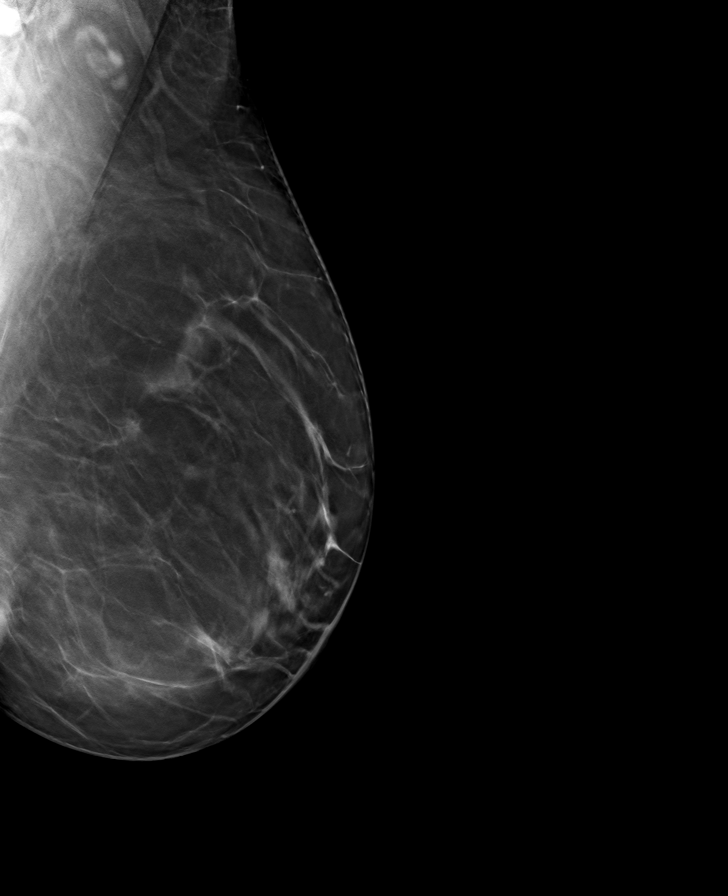

[R CC tomo · tomo slice 44/87.0]
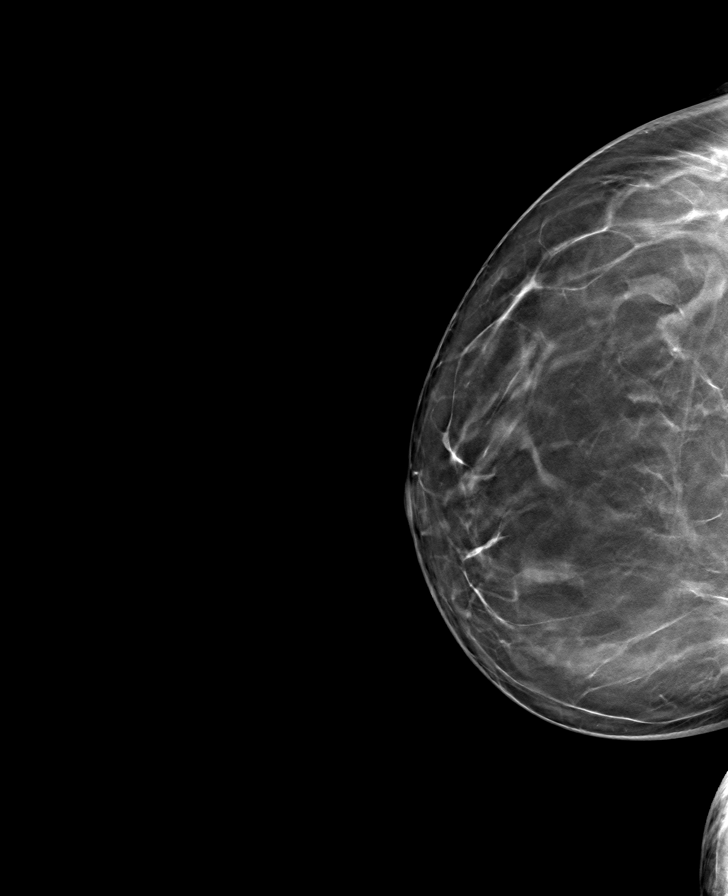

[8 of 24 positions shown; findings below may reference images not displayed]

ACR Breast Density Category b: There are scattered areas of
fibroglandular density.
FINDINGS: There are no findings suspicious for malignancy. Images were
processed with CAD.
IMPRESSION: No mammographic evidence of malignancy. A result letter of this
screening mammogram will be mailed directly to the patient.

RECOMMENDATION:
Screening mammogram in one year. (Code:[id number]-775)

BI-RADS CATEGORY  1: Negative.

## 2021-12-19 DIAGNOSIS — J0110 Acute frontal sinusitis, unspecified: Secondary | ICD-10-CM | POA: Diagnosis not present

## 2021-12-19 DIAGNOSIS — H00016 Hordeolum externum left eye, unspecified eyelid: Secondary | ICD-10-CM | POA: Diagnosis not present

## 2021-12-19 DIAGNOSIS — J0100 Acute maxillary sinusitis, unspecified: Secondary | ICD-10-CM | POA: Diagnosis not present

## 2022-01-23 DIAGNOSIS — L309 Dermatitis, unspecified: Secondary | ICD-10-CM | POA: Diagnosis not present

## 2022-01-23 DIAGNOSIS — L570 Actinic keratosis: Secondary | ICD-10-CM | POA: Diagnosis not present

## 2022-01-23 DIAGNOSIS — D485 Neoplasm of uncertain behavior of skin: Secondary | ICD-10-CM | POA: Diagnosis not present

## 2022-01-24 DIAGNOSIS — D0472 Carcinoma in situ of skin of left lower limb, including hip: Secondary | ICD-10-CM | POA: Diagnosis not present

## 2022-02-20 DIAGNOSIS — L988 Other specified disorders of the skin and subcutaneous tissue: Secondary | ICD-10-CM | POA: Diagnosis not present

## 2022-02-20 DIAGNOSIS — D0472 Carcinoma in situ of skin of left lower limb, including hip: Secondary | ICD-10-CM | POA: Diagnosis not present

## 2022-03-06 DIAGNOSIS — L089 Local infection of the skin and subcutaneous tissue, unspecified: Secondary | ICD-10-CM | POA: Diagnosis not present

## 2022-04-04 DIAGNOSIS — L089 Local infection of the skin and subcutaneous tissue, unspecified: Secondary | ICD-10-CM | POA: Diagnosis not present

## 2022-04-08 DIAGNOSIS — M25551 Pain in right hip: Secondary | ICD-10-CM | POA: Insufficient documentation

## 2022-04-11 DIAGNOSIS — Z6822 Body mass index (BMI) 22.0-22.9, adult: Secondary | ICD-10-CM | POA: Diagnosis not present

## 2022-04-11 DIAGNOSIS — Z1330 Encounter for screening examination for mental health and behavioral disorders, unspecified: Secondary | ICD-10-CM | POA: Diagnosis not present

## 2022-04-11 DIAGNOSIS — Z13820 Encounter for screening for osteoporosis: Secondary | ICD-10-CM | POA: Diagnosis not present

## 2022-04-11 DIAGNOSIS — Z0000 Encounter for general adult medical examination without abnormal findings: Secondary | ICD-10-CM | POA: Diagnosis not present

## 2022-04-27 ENCOUNTER — Other Ambulatory Visit: Payer: Self-pay | Admitting: Obstetrics and Gynecology

## 2022-04-27 DIAGNOSIS — Z1231 Encounter for screening mammogram for malignant neoplasm of breast: Secondary | ICD-10-CM

## 2022-05-02 ENCOUNTER — Ambulatory Visit
Admission: RE | Admit: 2022-05-02 | Discharge: 2022-05-02 | Disposition: A | Discharge status: Home / Self Care | Payer: BC Managed Care – PPO | Source: Ambulatory Visit | Attending: Obstetrics and Gynecology | Admitting: Obstetrics and Gynecology

## 2022-05-02 DIAGNOSIS — Z1231 Encounter for screening mammogram for malignant neoplasm of breast: Secondary | ICD-10-CM | POA: Diagnosis not present

## 2022-05-17 DIAGNOSIS — M25551 Pain in right hip: Secondary | ICD-10-CM | POA: Diagnosis not present

## 2022-05-23 DIAGNOSIS — M25551 Pain in right hip: Secondary | ICD-10-CM | POA: Diagnosis not present

## 2022-05-30 DIAGNOSIS — L570 Actinic keratosis: Secondary | ICD-10-CM | POA: Diagnosis not present

## 2022-05-30 DIAGNOSIS — L814 Other melanin hyperpigmentation: Secondary | ICD-10-CM | POA: Diagnosis not present

## 2022-05-30 DIAGNOSIS — L821 Other seborrheic keratosis: Secondary | ICD-10-CM | POA: Diagnosis not present

## 2022-05-30 DIAGNOSIS — I781 Nevus, non-neoplastic: Secondary | ICD-10-CM | POA: Diagnosis not present

## 2022-06-19 NOTE — Progress Notes (Signed)
57 y.o. [id number] Married White or Caucasian Not Hispanic or Latino female here for annual exam.    She is having constipation. She can't loose weight. She isn't sleeping well either. Hasn't slept well in years.   No vaginal bleeding. Sexually active, some dryness.   She has had issues with right hip pain, earlier this year she sprained her ankle.   Her 78 year old step son passed away in 04-30-22.    Had basal cell cancer removed from her left calf in 1/24.   No LMP recorded. Patient has had an ablation.          Sexually active: Yes.    The current method of family planning is tubal ligation.    Exercising: Yes.     Walking Smoker:  no  Health Maintenance: Pap: 06/22/21 WNL hr hpv neg,  04-09-2018 neg HPV HR neg, 03-02-17 ASCUS HPV HR neg  History of abnormal Pap:  Yes colpo 2015, h/o LSIL  MMG:  05/02/22 Bi-rads 1 neg  BMD:  04/18/21 Normal ( care everywhere)  Colonoscopy: 12/2014 F/u 10 years  TDaP:  2019 Gardasil: na   reports that she has never smoked. She has never used smokeless tobacco. She reports that she does not drink alcohol and does not use drugs. 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 She has 2 kids, daughter and son. Her husband has 4 sons. Her daughter has a 80.64 year old son and a 44 month old son (in [place]).   Past Medical History:  Diagnosis Date   Broken ankle    right   High risk HPV infection 10/13   neg pap, +HR HPV, 16/18 genotype neg   LGSIL (low grade squamous intraepithelial dysplasia)    12/14 HPV HR not detected   Stress fracture     Past Surgical History:  Procedure Laterality Date   ANKLE SURGERY     times 2, right ankle   COLPOSCOPY     HTA (ablation)  08/10/04   REFRACTIVE SURGERY  2003   TUBAL LIGATION     BTSP    Current Outpatient Medications  Medication Sig Dispense Refill   betamethasone valerate ointment (VALISONE) 0.1 % Apply 1 Application topically 2 (two) times daily. 30 g 0   cetirizine (ZYRTEC) 10 MG tablet Take  10 mg by mouth at bedtime.     Cyanocobalamin (B-12 PO) Take by mouth.     valACYclovir (VALTREX) 500 MG tablet 4 tablets po q 12 hours x 2 doses 30 tablet 12   No current facility-administered medications for this visit.    Family History  Problem Relation Age of Onset   Aneurysm Mother    Hypertension Mother    Thyroid disease Mother    Hypertension Sister    Cancer Maternal Uncle        lung    Review of Systems  All other systems reviewed and are negative.   Exam:   BP 110/64   Pulse 78   Ht 5' 3.39" (1.61 m)   Wt 130 lb (59 kg)   SpO2 100%   BMI 22.75 kg/m   Weight change: @WEIGHTCHANGE @ Height:   Height: 5' 3.39" (161 cm)  Ht Readings from Last 3 Encounters:  06/26/22 5' 3.39" (1.61 m)  06/22/21 5' 3.5" (1.613 m)  05/26/20 5\' 4"  (1.626 m)    General appearance: alert, cooperative and appears stated age Head: Normocephalic, without obvious abnormality, atraumatic Neck: no adenopathy, supple, symmetrical,  trachea midline and thyroid normal to inspection and palpation Lungs: clear to auscultation bilaterally Cardiovascular: regular rate and rhythm Breasts: normal appearance, no masses or tenderness Abdomen: soft, non-tender; non distended,  no masses,  no organomegaly Extremities: extremities normal, atraumatic, no cyanosis or edema Skin: Skin color, texture, turgor normal. No rashes or lesions Lymph nodes: Cervical, supraclavicular, and axillary nodes normal. No abnormal inguinal nodes palpated Neurologic: Grossly normal   Pelvic: External genitalia:  no lesions              Urethra:  normal appearing urethra with no masses, tenderness or lesions              Bartholins and Skenes: normal                 Vagina: atrophic appearing vagina with normal color and discharge, no lesions              Cervix: no lesions               Bimanual Exam:  Uterus:  normal size, contour, position, consistency, mobility, non-tender              Adnexa: no mass, fullness,  tenderness               Rectovaginal: Confirms               Anus:  normal sphincter tone, no lesions  Carolynn Serve, CMA chaperoned for the exam.  1. Well woman exam Discussed breast self exam Discussed calcium and vit D intake No pap today Mammogram UTD Colonoscopy UTD Labs UTD  2. Hx of cold sores - valACYclovir (VALTREX) 500 MG tablet; 4 tablets po q 12 hours x 2 doses  Dispense: 30 tablet; Refill: 12  3. Vaginal dryness Samples of uberlube given

## 2022-06-26 ENCOUNTER — Ambulatory Visit (INDEPENDENT_AMBULATORY_CARE_PROVIDER_SITE_OTHER): Payer: BC Managed Care – PPO | Admitting: Obstetrics and Gynecology

## 2022-06-26 ENCOUNTER — Encounter: Payer: Self-pay | Admitting: Obstetrics and Gynecology

## 2022-06-26 VITALS — BP 110/64 | HR 78 | Ht 63.39 in | Wt 130.0 lb

## 2022-06-26 DIAGNOSIS — Z8619 Personal history of other infectious and parasitic diseases: Secondary | ICD-10-CM

## 2022-06-26 DIAGNOSIS — N898 Other specified noninflammatory disorders of vagina: Secondary | ICD-10-CM | POA: Diagnosis not present

## 2022-06-26 DIAGNOSIS — Z01419 Encounter for gynecological examination (general) (routine) without abnormal findings: Secondary | ICD-10-CM

## 2022-06-26 MED ORDER — VALACYCLOVIR HCL 500 MG PO TABS: ORAL_TABLET | ORAL | 12 refills | Status: DC

## 2022-06-26 NOTE — Patient Instructions (Signed)

## 2022-07-17 DIAGNOSIS — J302 Other seasonal allergic rhinitis: Secondary | ICD-10-CM | POA: Diagnosis not present

## 2022-07-17 DIAGNOSIS — L239 Allergic contact dermatitis, unspecified cause: Secondary | ICD-10-CM | POA: Diagnosis not present

## 2022-07-25 DIAGNOSIS — L739 Follicular disorder, unspecified: Secondary | ICD-10-CM | POA: Diagnosis not present

## 2022-07-25 DIAGNOSIS — L570 Actinic keratosis: Secondary | ICD-10-CM | POA: Diagnosis not present

## 2022-07-25 DIAGNOSIS — Z85828 Personal history of other malignant neoplasm of skin: Secondary | ICD-10-CM | POA: Diagnosis not present

## 2022-07-25 DIAGNOSIS — L814 Other melanin hyperpigmentation: Secondary | ICD-10-CM | POA: Diagnosis not present

## 2022-07-25 DIAGNOSIS — D485 Neoplasm of uncertain behavior of skin: Secondary | ICD-10-CM | POA: Diagnosis not present

## 2022-08-01 DIAGNOSIS — M25551 Pain in right hip: Secondary | ICD-10-CM | POA: Diagnosis not present

## 2022-08-09 DIAGNOSIS — M1611 Unilateral primary osteoarthritis, right hip: Secondary | ICD-10-CM | POA: Diagnosis not present

## 2022-08-15 DIAGNOSIS — M25551 Pain in right hip: Secondary | ICD-10-CM | POA: Diagnosis not present

## 2022-09-14 DIAGNOSIS — M1611 Unilateral primary osteoarthritis, right hip: Secondary | ICD-10-CM | POA: Diagnosis not present

## 2022-10-23 DIAGNOSIS — M1811 Unilateral primary osteoarthritis of first carpometacarpal joint, right hand: Secondary | ICD-10-CM | POA: Diagnosis not present

## 2022-11-06 ENCOUNTER — Encounter: Payer: Self-pay | Admitting: Nurse Practitioner

## 2022-11-06 ENCOUNTER — Ambulatory Visit (INDEPENDENT_AMBULATORY_CARE_PROVIDER_SITE_OTHER): Payer: BC Managed Care – PPO | Admitting: Nurse Practitioner

## 2022-11-06 VITALS — BP 118/72 | HR 78 | Ht 63.39 in | Wt 131.8 lb

## 2022-11-06 DIAGNOSIS — N8110 Cystocele, unspecified: Secondary | ICD-10-CM

## 2022-11-06 DIAGNOSIS — N812 Incomplete uterovaginal prolapse: Secondary | ICD-10-CM

## 2022-11-06 NOTE — Progress Notes (Signed)
   Acute Office Visit  Subjective:    Patient ID: Nicole Carlson, female    DOB: 06-Jan-1966, 57 y.o.   MRN: [id number]   HPI 57 y.o. [id number] presents today for vaginal bulge that she first noticed it a few weeks ago. No pain, no change. Noticed when she voided once that urine sprayed. Denies incontinence, urgency or inability to empty bladder. No issues with bowels. Has had 2 vaginal deliveries, does lots of lifting with grandsons. Sexually active.   No LMP recorded. Patient has had an ablation.    Review of Systems  Constitutional: Negative.   Genitourinary:  Negative for difficulty urinating, dysuria, frequency, pelvic pain and urgency.       Vaginal bulge       Objective:    Physical Exam Constitutional:      Appearance: Normal appearance.  Genitourinary:    Vagina: Normal.     Cervix: Normal.     Uterus: With uterine prolapse (First degree).      Comments: Bladder flush with vaginal opening with bearing down, grade 3 MOGS 3    BP 118/72 (BP Location: Right Arm, Patient Position: Sitting, Cuff Size: Normal)   Pulse 78   Ht 5' 3.39" (1.61 m)   Wt 131 lb 12.8 oz (59.8 kg)   BMI 23.06 kg/m  Wt Readings from Last 3 Encounters:  11/06/22 131 lb 12.8 oz (59.8 kg)  06/26/22 130 lb (59 kg)  09/18/21 131 lb (59.4 kg)        Patient informed chaperone available to be present for breast and/or pelvic exam. Patient has requested no chaperone to be present. Patient has been advised what will be completed during breast and pelvic exam.   Assessment & Plan:   Problem List Items Addressed This Visit   None Visit Diagnoses     Baden-Walker grade 3 cystocele    -  Primary   Relevant Orders   Ambulatory referral to Urogynecology   First degree uterine prolapse       Relevant Orders   Ambulatory referral to Urogynecology      Plan: Educated on causes, diagnosis and management of pelvic organ prolapse. Discussed pelvic floor PT, pessary and surgical options. Interested  in surgery. Referral to Urogyn placed.   Return if symptoms worsen or fail to improve.    Olivia Mackie DNP, 11:33 AM 11/06/2022

## 2022-11-07 ENCOUNTER — Encounter: Payer: Self-pay | Admitting: Nurse Practitioner

## 2022-11-16 DIAGNOSIS — M1811 Unilateral primary osteoarthritis of first carpometacarpal joint, right hand: Secondary | ICD-10-CM | POA: Diagnosis not present

## 2022-11-23 DIAGNOSIS — S56901A Unspecified injury of unspecified muscles, fascia and tendons at forearm level, right arm, initial encounter: Secondary | ICD-10-CM | POA: Diagnosis not present

## 2022-11-23 DIAGNOSIS — M1811 Unilateral primary osteoarthritis of first carpometacarpal joint, right hand: Secondary | ICD-10-CM | POA: Diagnosis not present

## 2022-11-29 NOTE — Progress Notes (Signed)
New Patient Evaluation and Consultation  Referring Provider: Olivia Mackie, NP PCP: Patrcia Dolly, DO Date of Service: 11/30/2022  SUBJECTIVE Chief Complaint: New Patient (Initial Visit) Nicole Carlson is a 57 y.o. female here today for prolapse.)  History of Present Illness: MONIKE BRAGDON is a 57 y.o. White or Caucasian female seen in consultation at the request of Wyline Beady, NP for evaluation of pelvic organ prolapse.    Vaginal bulge noted to vaginal opening 1-2 months ago with self examination after spraying of urine Pending R hip replacement 01/08/23. History of R ankle surgery at a concert in 2019 with [employer] grandson S/p endometrial ablation Son passed away from MI 04/12/2022 and cares for her best friend's mother who is 101yo  Urinary Symptoms: Does not leak urine.   Day time voids 10.  Nocturia: 2-3 times per night to void due to urge to void and insomnia Voiding dysfunction:  does not empty bladder well.  Patient does not use a catheter to empty bladder.  When urinating, patient feels dribbling after finishing Drinks: 64oz water per day, decrease intake with decreased frequency in the past  UTIs:  0  UTI's in the last year.   Denies history of blood in urine, kidney or bladder stones, pyelonephritis, bladder cancer, and kidney cancer   Pelvic Organ Prolapse Symptoms:                  Patient Admits to a feeling of a bulge the vaginal area. It has been present for 1 months.  Patient Admits to seeing a bulge.  This bulge is bothersome.  Bowel Symptom: Bowel movements: 4 time(s) per week Stool consistency: soft  Straining: no.  Splinting: yes.  Incomplete evacuation: no.  Patient Denies accidental bowel leakage / fecal incontinence Bowel regimen: fiber and stool softener Last colonoscopy: Date 2015 per pt, Results due in 10 years [facility] Colonoscopy          Overdue - Colonoscopy (Every 10 Years) Never done    No completion history exists  for this topic.            Sexual Function Sexually active: yes.  Sexual orientation: Straight Pain with sex: No  Pelvic Pain Denies pelvic pain   Past Medical History:  Past Medical History:  Diagnosis Date   Broken ankle    right   High risk HPV infection 10/13   neg pap, +HR HPV, 16/18 genotype neg   LGSIL (low grade squamous intraepithelial dysplasia)    12/14 HPV HR not detected   Stress fracture      Past Surgical History:   Past Surgical History:  Procedure Laterality Date   ANKLE SURGERY     times 2, right ankle   COLPOSCOPY     HTA (ablation)  08/10/04   REFRACTIVE SURGERY  2003   TUBAL LIGATION     BTSP     Past OB/GYN History: OB History  Gravida Para Term Preterm AB Living  3 2 2   1 2   SAB IAB Ectopic Multiple Live Births  1       2    # Outcome Date GA Lbr Len/2nd Weight Sex Type Anes PTL Lv  3 SAB           2 Term     F Vag-Spont   LIV  1 Term     M Vag-Forceps   LIV    Vaginal deliveries: 2, largest infant 6lb with laceration repair,  Forceps/ Vacuum deliveries: 1 forceps, Cesarean section: 0 Menopausal: Yes, at age 19, Denies vaginal bleeding since menopause Contraception: tubal ligation and s/p endometrial ablation Last pap smear.  Any history of abnormal pap smears: yes with colposcopy in 2015    Component Value Date/Time   DIAGPAP  06/22/2021 0918    - Negative for intraepithelial lesion or malignancy (NILM)   DIAGPAP  04/09/2018 0000    NEGATIVE FOR INTRAEPITHELIAL LESIONS OR MALIGNANCY.   DIAGPAP (A) 03/21/2017 0000    ATYPICAL SQUAMOUS CELLS OF UNDETERMINED SIGNIFICANCE (ASC-US).   HPVHIGH Negative 06/22/2021 0918   ADEQPAP  06/22/2021 0918    Satisfactory for evaluation; transformation zone component PRESENT.   ADEQPAP  04/09/2018 0000    Satisfactory for evaluation  endocervical/transformation zone component PRESENT.   ADEQPAP (A) 03/21/2017 0000    Satisfactory for evaluation  endocervical/transformation zone component  PRESENT.    Medications: Patient has a current medication list which includes the following prescription(s): cetirizine, cholecalciferol, cyanocobalamin, diclofenac, eq arthritis pain reliever, [START ON 12/03/2022] estradiol, gabapentin, and valacyclovir.   Allergies: Patient has No Known Allergies.   Social History:  Social History   Tobacco Use   Smoking status: Never   Smokeless tobacco: Never  Vaping Use   Vaping status: Never Used  Substance Use Topics   Alcohol use: No   Drug use: No    Relationship status: married Patient lives with her husband.   Patient is employed. Regular exercise: Yes: walks 3-5 miles/day History of abuse: No  Family History:   Family History  Problem Relation Age of Onset   Aneurysm Mother    Hypertension Mother    Thyroid disease Mother    Hypertension Sister    Cancer Maternal Uncle        lung     Review of Systems: Review of Systems  Constitutional:  Positive for malaise/fatigue. Negative for fever and weight loss.       Weight gain   Respiratory:  Negative for cough, shortness of breath and wheezing.   Cardiovascular:  Negative for chest pain, palpitations and leg swelling.  Gastrointestinal:  Negative for abdominal pain and blood in stool.  Genitourinary:  Negative for hematuria.  Skin:  Negative for rash.  Neurological:  Positive for weakness. Negative for dizziness and headaches.  Endo/Heme/Allergies:  Bruises/bleeds easily.       Hot flashes  Psychiatric/Behavioral:  Negative for depression. The patient is not nervous/anxious.      OBJECTIVE Physical Exam: Vitals:   11/30/22 0827  BP: 135/84  Pulse: (!) 59  Weight: 130 lb 9.6 oz (59.2 kg)  Height: 5' 3.5" (1.613 m)   Physical Exam Constitutional:      General: She is not in acute distress.    Appearance: Normal appearance.  Genitourinary:     Bladder and urethral meatus normal.     No lesions in the vagina.     Right Labia: No rash, tenderness, lesions, skin  changes or Bartholin's cyst.    Left Labia: No tenderness, lesions, skin changes, Bartholin's cyst or rash.    No vaginal discharge, erythema, tenderness, bleeding, ulceration or granulation tissue.     Anterior, posterior and apical vaginal prolapse present.    Moderate vaginal atrophy present.     Right Adnexa: not tender, not full and no mass present.    Left Adnexa: not tender, not full and no mass present.    No cervical motion tenderness, discharge, friability, lesion, polyp or nabothian cyst.  Uterus is not enlarged, fixed, tender or irregular.     No uterine mass detected.    Urethral meatus caruncle not present.    No urethral prolapse, tenderness, mass, hypermobility, discharge or stress urinary incontinence with cough stress test present.     Bladder is not tender, urgency on palpation not present and masses not present.      Pelvic Floor: Levator muscle strength is 4/5.    Levator ani not tender, obturator internus not tender, no asymmetrical contractions present and no pelvic spasms present.    Pelvic Floor comments: Valsalva when she is instructed to perform Kegel exercise.    Symmetrical pelvic sensation, anal wink present and BC reflex present. Cardiovascular:     Rate and Rhythm: Normal rate.  Pulmonary:     Effort: Pulmonary effort is normal. No respiratory distress.  Abdominal:     General: There is no distension.     Palpations: There is no mass.     Tenderness: There is no abdominal tenderness.     Hernia: No hernia is present.  Neurological:     Mental Status: She is alert.  Vitals reviewed. Exam conducted with a chaperone present.      POP-Q:   POP-Q  0                                            Aa   0                                           Ba  -4                                              C   2                                            Gh  2                                            Pb  7                                             tvl   0                                            Ap  0                                            Bp  -4  D    Post-Void Residual (PVR) by Bladder Scan: In order to evaluate bladder emptying, we discussed obtaining a postvoid residual and patient agreed to this procedure.  Procedure: The ultrasound unit was placed on the patient's abdomen in the suprapubic region after the patient had voided.    Post Void Residual - 11/30/22 0840       Post Void Residual   Post Void Residual 56 mL              Laboratory Results: Lab Results  Component Value Date   COLORU yellow 11/30/2022   CLARITYU clear 11/30/2022   GLUCOSEUR Negative 11/30/2022   BILIRUBINUR negative 11/30/2022   KETONESU negative 11/30/2022   SPECGRAV 1.020 11/30/2022   RBCUR negative 11/30/2022   PHUR 6.0 11/30/2022   PROTEINUR Negative 11/30/2022   UROBILINOGEN negative (A) 11/30/2022   LEUKOCYTESUR Negative 11/30/2022    Lab Results  Component Value Date   CREATININE 0.83 06/22/2020   CREATININE 0.80 06/06/2020   CREATININE 0.79 05/26/2020    No results found for: "HGBA1C"  Lab Results  Component Value Date   HGB 13.7 05/26/2020     ASSESSMENT AND PLAN Ms. Nowaczyk is a 57 y.o. with:  1. Urinary frequency   2. Feeling of incomplete bladder emptying   3. Vaginal atrophy   4. Pelvic organ prolapse quantification stage 2 cystocele   5. Nocturia     Urinary frequency Assessment & Plan: - POCT UA + uro only, bladder scan 56mL with no sign or symptom of urinary retention - We discussed the symptoms of urinary urgency, urinary frequency, nocturia, with or without urge incontinence.  While we do not know the exact etiology of OAB, several treatment options exist. We discussed management including behavioral therapy (decreasing bladder irritants, urge suppression strategies, timed voids, bladder retraining), physical therapy, medication; for refractory  cases posterior tibial nerve stimulation, sacral neuromodulation, and intravesical botulinum toxin injection.  - referral to pelvic floor PT  Orders: -     POCT urinalysis dipstick -     Estradiol; Place 0.5 g vaginally 2 (two) times a week. Place 0.5g nightly for two weeks then twice a week after  Dispense: 30 g; Refill: 3  Feeling of incomplete bladder emptying Assessment & Plan: - bladder scan 56mL with no sign or symptom of urinary retention  - repeat if clinical change   Vaginal atrophy Assessment & Plan: - For symptomatic vaginal atrophy options include lubrication with a water-based lubricant, personal hygiene measures and barrier protection against wetness, and estrogen replacement in the form of vaginal cream, vaginal tablets, or a time-released vaginal ring.   - Rx vaginal estrogen    Orders: -     Estradiol; Place 0.5 g vaginally 2 (two) times a week. Place 0.5g nightly for two weeks then twice a week after  Dispense: 30 g; Refill: 3  Pelvic organ prolapse quantification stage 2 cystocele Assessment & Plan: - For treatment of pelvic organ prolapse, we discussed options for management including expectant management, conservative management, and surgical management, such as Kegels, a pessary, pelvic floor physical therapy, and specific surgical procedures. - Patient valsalva during Kegel exercise, referral sent to pelvic floor PT - will postpone additional treatment until after shoulder surgery   Orders: -     Estradiol; Place 0.5 g vaginally 2 (two) times a week. Place 0.5g nightly for two weeks then twice a week after  Dispense: 30 g; Refill: 3  Nocturia Assessment & Plan: For night time frequency: -  avoid fluid intake after 6pm - fluid management and reduce caffeine use   Time spent: I spent 64 minutes dedicated to the care of this patient on the date of this encounter to include pre-visit review of records, face-to-face time with the patient discussing pelvic organ  prolapse, urinary frequency, vaginal atrophy, nocturia and post visit documentation and ordering medication/ testing.    Loleta Chance, MD

## 2022-11-30 ENCOUNTER — Ambulatory Visit (INDEPENDENT_AMBULATORY_CARE_PROVIDER_SITE_OTHER): Payer: BC Managed Care – PPO | Admitting: Obstetrics

## 2022-11-30 ENCOUNTER — Encounter: Payer: Self-pay | Admitting: Obstetrics

## 2022-11-30 VITALS — BP 135/84 | HR 59 | Ht 63.5 in | Wt 130.6 lb

## 2022-11-30 DIAGNOSIS — N952 Postmenopausal atrophic vaginitis: Secondary | ICD-10-CM

## 2022-11-30 DIAGNOSIS — R3914 Feeling of incomplete bladder emptying: Secondary | ICD-10-CM | POA: Diagnosis not present

## 2022-11-30 DIAGNOSIS — N8110 Cystocele, unspecified: Secondary | ICD-10-CM | POA: Diagnosis not present

## 2022-11-30 DIAGNOSIS — R350 Frequency of micturition: Secondary | ICD-10-CM | POA: Diagnosis not present

## 2022-11-30 DIAGNOSIS — R351 Nocturia: Secondary | ICD-10-CM

## 2022-11-30 LAB — POCT URINALYSIS DIPSTICK
Bilirubin, UA: NEGATIVE
Blood, UA: NEGATIVE
Glucose, UA: NEGATIVE
Ketones, UA: NEGATIVE
Leukocytes, UA: NEGATIVE
Nitrite, UA: NEGATIVE
Protein, UA: NEGATIVE
Spec Grav, UA: 1.02 (ref 1.010–1.025)
Urobilinogen, UA: NEGATIVE U/dL — AB
pH, UA: 6 (ref 5.0–8.0)

## 2022-11-30 MED ORDER — ESTRADIOL 0.1 MG/GM VA CREA
0.5000 g | TOPICAL_CREAM | VAGINAL | 3 refills | Status: DC
Start: 2022-12-03 — End: 2023-07-29

## 2022-11-30 NOTE — Assessment & Plan Note (Addendum)
-   POCT UA + uro only, bladder scan 56mL with no sign or symptom of urinary retention - We discussed the symptoms of urinary urgency, urinary frequency, nocturia, with or without urge incontinence.  While we do not know the exact etiology of OAB, several treatment options exist. We discussed management including behavioral therapy (decreasing bladder irritants, urge suppression strategies, timed voids, bladder retraining), physical therapy, medication; for refractory cases posterior tibial nerve stimulation, sacral neuromodulation, and intravesical botulinum toxin injection.  - referral to pelvic floor PT

## 2022-11-30 NOTE — Patient Instructions (Addendum)
You have a stage 2 (out of 4) prolapse.  We discussed the fact that it is not life threatening but there are several treatment options. For treatment of pelvic organ prolapse, we discussed options for management including expectant management, conservative management, and surgical management, such as Kegels, a pessary, pelvic floor physical therapy, and specific surgical procedures.     We discussed the symptoms of overactive bladder (OAB), which include urinary urgency, urinary frequency, night-time urination, with or without urge incontinence.  We discussed management including behavioral therapy (decreasing bladder irritants by following a bladder diet, urge suppression strategies, timed voids, bladder retraining), physical therapy, medication; and for refractory cases posterior tibial nerve stimulation, sacral neuromodulation, and intravesical botulinum toxin injection.   For night time frequency: - avoid fluid intake after 6pm  Constipation: Our goal is to achieve formed bowel movements daily or every-other-day.  You may need to try different combinations of the following options to find what works best for you - everybody's body works differently so feel free to adjust the dosages as needed.  Some options to help maintain bowel health include:  Dietary changes (more leafy greens, vegetables and fruits; less processed foods) Fiber supplementation (Benefiber, FiberCon, Metamucil or Psyllium). Start slow and increase gradually to full dose. Over-the-counter agents such as: stool softeners (Docusate or Colace) and/or laxatives (Miralax, milk of magnesia)  "Power Pudding" is a natural mixture that may help your constipation.  To make blend 1 cup applesauce, 1 cup wheat bran, and 3/4 cup prune juice, refrigerate and then take 1 tablespoon daily with a large glass of water as needed.   Women should try to eat at least 21 to 25 grams of fiber a day, while men should aim for 30 to 38 grams a day. You can  add fiber to your diet with food or a fiber supplement such as psyllium (metamucil), benefiber, or fibercon.   Here's a look at how much dietary fiber is found in some common foods. When buying packaged foods, check the Nutrition Facts label for fiber content. It can vary among brands.  Fruits Serving size Total fiber (grams)*  Raspberries 1 cup 8.0  Pear 1 medium 5.5  Apple, with skin 1 medium 4.5  Banana 1 medium 3.0  Orange 1 medium 3.0  Strawberries 1 cup 3.0   Vegetables Serving size Total fiber (grams)*  Green peas, boiled 1 cup 9.0  Broccoli, boiled 1 cup chopped 5.0  Turnip greens, boiled 1 cup 5.0  Brussels sprouts, boiled 1 cup 4.0  Potato, with skin, baked 1 medium 4.0  Sweet corn, boiled 1 cup 3.5  Cauliflower, raw 1 cup chopped 2.0  Carrot, raw 1 medium 1.5   Grains Serving size Total fiber (grams)*  Spaghetti, whole-wheat, cooked 1 cup 6.0  Barley, pearled, cooked 1 cup 6.0  Bran flakes 3/4 cup 5.5  Quinoa, cooked 1 cup 5.0  Oat bran muffin 1 medium 5.0  Oatmeal, instant, cooked 1 cup 5.0  Popcorn, air-popped 3 cups 3.5  Brown rice, cooked 1 cup 3.5  Bread, whole-wheat 1 slice 2.0  Bread, rye 1 slice 2.0   Legumes, nuts and seeds Serving size Total fiber (grams)*  Split peas, boiled 1 cup 16.0  Lentils, boiled 1 cup 15.5  Black beans, boiled 1 cup 15.0  Baked beans, canned 1 cup 10.0  Chia seeds 1 ounce 10.0  Almonds 1 ounce (23 nuts) 3.5  Pistachios 1 ounce (49 nuts) 3.0  Sunflower kernels 1 ounce 3.0  *Rounded to  nearest 0.5 gram. Source: [employer] for [employer], Legacy Release    For vaginal atrophy (thinning of the vaginal tissue that can cause dryness and burning) and UTI prevention we discussed estrogen replacement in the form of vaginal cream.   Start vaginal estrogen therapy nightly for two weeks then 2 times weekly at night. This can be placed with your finger or an applicator inside the vagina and around the  urethra.  Please let us know if the prescription is too expensive and we can look for alternative options.   Is vaginal estrogen therapy safe for me? Vaginal estrogen preparations act on the vaginal skin, and only a very tiny amount is absorbed into the bloodstream (0.01%).  They work in a similar way to hand or face cream.  There is minimal absorption and they are therefore perfectly safe. If you have had breast cancer and have persistent troublesome symptoms which aren't settling with vaginal moisturisers and lubricants, local estrogen treatment may be a possibility, but consultation with your oncologist should take place first.   Start vaginal estrogen therapy nightly for two weeks then 2 times weekly at night for treatment of vaginal atrophy (dryness of the vaginal tissues).  Please let us know if the prescription is too expensive and we can look for alternative options.

## 2022-11-30 NOTE — Assessment & Plan Note (Signed)
-   bladder scan 56mL with no sign or symptom of urinary retention  - repeat if clinical change

## 2022-11-30 NOTE — Assessment & Plan Note (Signed)
For night time frequency: - avoid fluid intake after 6pm - fluid management and reduce caffeine use

## 2022-11-30 NOTE — Assessment & Plan Note (Signed)
-   For symptomatic vaginal atrophy options include lubrication with a water-based lubricant, personal hygiene measures and barrier protection against wetness, and estrogen replacement in the form of vaginal cream, vaginal tablets, or a time-released vaginal ring.   - Rx vaginal estrogen

## 2022-11-30 NOTE — Assessment & Plan Note (Addendum)
-   For treatment of pelvic organ prolapse, we discussed options for management including expectant management, conservative management, and surgical management, such as Kegels, a pessary, pelvic floor physical therapy, and specific surgical procedures. - Patient valsalva during Kegel exercise, referral sent to pelvic floor PT - will postpone additional treatment until after shoulder surgery

## 2022-12-21 ENCOUNTER — Encounter: Payer: Self-pay | Admitting: Obstetrics

## 2022-12-24 DIAGNOSIS — Z0189 Encounter for other specified special examinations: Secondary | ICD-10-CM | POA: Diagnosis not present

## 2023-01-07 DIAGNOSIS — S56901A Unspecified injury of unspecified muscles, fascia and tendons at forearm level, right arm, initial encounter: Secondary | ICD-10-CM | POA: Diagnosis not present

## 2023-01-07 DIAGNOSIS — Z01810 Encounter for preprocedural cardiovascular examination: Secondary | ICD-10-CM | POA: Diagnosis not present

## 2023-01-07 DIAGNOSIS — M1811 Unilateral primary osteoarthritis of first carpometacarpal joint, right hand: Secondary | ICD-10-CM | POA: Diagnosis not present

## 2023-01-07 DIAGNOSIS — M1611 Unilateral primary osteoarthritis, right hip: Secondary | ICD-10-CM | POA: Diagnosis not present

## 2023-01-08 DIAGNOSIS — M25551 Pain in right hip: Secondary | ICD-10-CM | POA: Diagnosis not present

## 2023-01-08 DIAGNOSIS — I890 Lymphedema, not elsewhere classified: Secondary | ICD-10-CM | POA: Diagnosis not present

## 2023-01-08 DIAGNOSIS — Z791 Long term (current) use of non-steroidal anti-inflammatories (NSAID): Secondary | ICD-10-CM | POA: Diagnosis not present

## 2023-01-08 DIAGNOSIS — Z471 Aftercare following joint replacement surgery: Secondary | ICD-10-CM | POA: Diagnosis not present

## 2023-01-08 DIAGNOSIS — M1611 Unilateral primary osteoarthritis, right hip: Secondary | ICD-10-CM | POA: Diagnosis not present

## 2023-01-08 DIAGNOSIS — Z7952 Long term (current) use of systemic steroids: Secondary | ICD-10-CM | POA: Diagnosis not present

## 2023-01-08 DIAGNOSIS — Z96641 Presence of right artificial hip joint: Secondary | ICD-10-CM | POA: Diagnosis not present

## 2023-01-08 DIAGNOSIS — M25751 Osteophyte, right hip: Secondary | ICD-10-CM | POA: Diagnosis not present

## 2023-01-19 DIAGNOSIS — Z96641 Presence of right artificial hip joint: Secondary | ICD-10-CM | POA: Diagnosis not present

## 2023-01-19 DIAGNOSIS — M1611 Unilateral primary osteoarthritis, right hip: Secondary | ICD-10-CM | POA: Diagnosis not present

## 2023-01-19 DIAGNOSIS — M25551 Pain in right hip: Secondary | ICD-10-CM | POA: Diagnosis not present

## 2023-01-19 DIAGNOSIS — Z471 Aftercare following joint replacement surgery: Secondary | ICD-10-CM | POA: Diagnosis not present

## 2023-01-19 DIAGNOSIS — I890 Lymphedema, not elsewhere classified: Secondary | ICD-10-CM | POA: Diagnosis not present

## 2023-03-11 ENCOUNTER — Encounter: Payer: Self-pay | Admitting: Obstetrics

## 2023-03-11 ENCOUNTER — Ambulatory Visit (INDEPENDENT_AMBULATORY_CARE_PROVIDER_SITE_OTHER): Payer: BC Managed Care – PPO | Admitting: Obstetrics

## 2023-03-11 VITALS — BP 129/83 | HR 78

## 2023-03-11 DIAGNOSIS — R351 Nocturia: Secondary | ICD-10-CM | POA: Diagnosis not present

## 2023-03-11 DIAGNOSIS — N952 Postmenopausal atrophic vaginitis: Secondary | ICD-10-CM | POA: Diagnosis not present

## 2023-03-11 DIAGNOSIS — R3914 Feeling of incomplete bladder emptying: Secondary | ICD-10-CM | POA: Diagnosis not present

## 2023-03-11 DIAGNOSIS — N8110 Cystocele, unspecified: Secondary | ICD-10-CM

## 2023-03-11 NOTE — Progress Notes (Signed)
 [facility] Return Visit  SUBJECTIVE  History of Present Illness: Nicole Carlson is a 58 y.o. female seen in follow-up for stage II pelvic organ prolapse, nocturia, feeling of incomplete bladder emptying, vaginal atrophy. Plan at last visit was referral to pelvic floor PT, vaginal estrogen.   R hip surgery 01/08/23 without complications, resumed 3-5 miles walking/day Day time voids 10.  Nocturia: 2-3 times per night to void due to urge to void and insomnia  Stopped vaginal estrogen, pending to resume Unchanged vaginal bulge  Past Medical History: Patient  has a past medical history of Broken ankle, High risk HPV infection (10/13), LGSIL (low grade squamous intraepithelial dysplasia), and Stress fracture.   Past Surgical History: She  has a past surgical history that includes HTA (ablation) (08/10/04); Tubal ligation; Refractive surgery (2003); Colposcopy; and Ankle surgery.   Medications: She has a current medication list which includes the following prescription(s): cetirizine, cholecalciferol, cyanocobalamin, diclofenac, eq arthritis pain reliever, estradiol , gabapentin, and valacyclovir .   Allergies: Patient has no known allergies.   Social History: Patient  reports that she has never smoked. She has never used smokeless tobacco. She reports that she does not drink alcohol and does not use drugs.     OBJECTIVE     Physical Exam: Vitals:   03/11/23 1502  BP: 129/83  Pulse: 78   Gen: No apparent distress, A&O x 3.  Detailed Urogynecologic Evaluation:  Deferred. Prior exam showed:  POP-Q 11/30/22   0                                            Aa   0                                           Ba   -4                                              C    2                                            Gh   2                                            Pb   7                                            tvl    0                                            Ap    0  Bp   -4                                              D        ASSESSMENT AND PLAN    Nicole Carlson is a 58 y.o. with:  1. Feeling of incomplete bladder emptying   2. Vaginal atrophy   3. Pelvic organ prolapse quantification stage 2 cystocele   4. Nocturia     Feeling of incomplete bladder emptying Assessment & Plan: - prior bladder scan 56mL with no sign or symptom of urinary retention  - repeat if clinical change - referral sent for pelvic floor PT  Orders: -     AMB referral to rehabilitation  Vaginal atrophy Assessment & Plan: - For symptomatic vaginal atrophy options include lubrication with a water-based lubricant, personal hygiene measures and barrier protection against wetness, and estrogen replacement in the form of vaginal cream, vaginal tablets, or a time-released vaginal ring.   - stopped vaginal estrogen due to difficulty with application after R hip surgery - resume vaginal estrogen with loading dose    Pelvic organ prolapse quantification stage 2 cystocele Assessment & Plan: - For treatment of pelvic organ prolapse, we discussed options for management including expectant management, conservative management, and surgical management, such as Kegels, a pessary, pelvic floor physical therapy, and specific surgical procedures. - Patient valsalva during Kegel exercise, referral sent to pelvic floor PT - pt would like to avoid surgical intervention at this time  Orders: -     AMB referral to rehabilitation  Nocturia Assessment & Plan: - avoid fluid intake after 6pm - fluid management and reduce caffeine use - unchanged after R hip surgery - declined medication for night time frequency  Orders: -     AMB referral to rehabilitation  Time spent: I spent 17 minutes dedicated to the care of this patient on the date of this encounter to include pre-visit review of records, face-to-face time with the patient discussing  stage II pelvic organ prolapse, feeling of incomplete bladder emptying, vaginal atrophy, nocturia, and post visit documentation and ordering medication/ testing.   Darlene Ehlers, MD

## 2023-03-11 NOTE — Assessment & Plan Note (Addendum)
-   avoid fluid intake after 6pm - fluid management and reduce caffeine use - unchanged after R hip surgery - declined medication for night time frequency

## 2023-03-11 NOTE — Assessment & Plan Note (Signed)
-   prior bladder scan 56mL with no sign or symptom of urinary retention  - repeat if clinical change - referral sent for pelvic floor PT

## 2023-03-11 NOTE — Assessment & Plan Note (Signed)
-   For symptomatic vaginal atrophy options include lubrication with a water-based lubricant, personal hygiene measures and barrier protection against wetness, and estrogen replacement in the form of vaginal cream, vaginal tablets, or a time-released vaginal ring.   - stopped vaginal estrogen due to difficulty with application after R hip surgery - resume vaginal estrogen with loading dose

## 2023-03-11 NOTE — Assessment & Plan Note (Addendum)
-   For treatment of pelvic organ prolapse, we discussed options for management including expectant management, conservative management, and surgical management, such as Kegels, a pessary, pelvic floor physical therapy, and specific surgical procedures. - Patient valsalva during Kegel exercise, referral sent to pelvic floor PT - pt would like to avoid surgical intervention at this time

## 2023-03-11 NOTE — Patient Instructions (Addendum)
 Please call pelvic floor physical therapy to schedule an appointment.   For vaginal atrophy (thinning of the vaginal tissue that can cause dryness and burning) and UTI prevention we discussed estrogen replacement in the form of vaginal cream.   Start vaginal estrogen therapy nightly for two weeks then 2 times weekly at night. This can be placed with your finger or an applicator inside the vagina and around the urethra.  Please let us  know if the prescription is too expensive and we can look for alternative options.   Is vaginal estrogen therapy safe for me? Vaginal estrogen preparations act on the vaginal skin, and only a very tiny amount is absorbed into the bloodstream (0.01%).  They work in a similar way to hand or face cream.  There is minimal absorption and they are therefore perfectly safe. If you have had breast cancer and have persistent troublesome symptoms which aren't settling with vaginal moisturisers and lubricants, local estrogen treatment may be a possibility, but consultation with your oncologist should take place first.

## 2023-04-02 ENCOUNTER — Other Ambulatory Visit: Payer: Self-pay | Admitting: Nurse Practitioner

## 2023-04-02 DIAGNOSIS — Z1231 Encounter for screening mammogram for malignant neoplasm of breast: Secondary | ICD-10-CM

## 2023-04-09 DIAGNOSIS — M1811 Unilateral primary osteoarthritis of first carpometacarpal joint, right hand: Secondary | ICD-10-CM | POA: Diagnosis not present

## 2023-05-08 ENCOUNTER — Ambulatory Visit
Admission: RE | Admit: 2023-05-08 | Discharge: 2023-05-08 | Disposition: A | Discharge status: Home / Self Care | Payer: BC Managed Care – PPO | Source: Ambulatory Visit

## 2023-05-08 DIAGNOSIS — Z1231 Encounter for screening mammogram for malignant neoplasm of breast: Secondary | ICD-10-CM

## 2023-05-08 NOTE — Therapy (Signed)
 OUTPATIENT PHYSICAL THERAPY FEMALE PELVIC EVALUATION   Patient Name: Nicole Carlson MRN: [id number] DOB:28-Feb-1965, 58 y.o., female Today's Date: 05/08/2023  END OF SESSION:   Past Medical History:  Diagnosis Date   Broken ankle    right   High risk HPV infection 10/13   neg pap, +HR HPV, 16/18 genotype neg   LGSIL (low grade squamous intraepithelial dysplasia)    12/14 HPV HR not detected   Stress fracture    Past Surgical History:  Procedure Laterality Date   ANKLE SURGERY     times 2, right ankle   COLPOSCOPY     HTA (ablation)  08/10/04   REFRACTIVE SURGERY  2003   TUBAL LIGATION     BTSP   Patient Active Problem List   Diagnosis Date Noted   Urinary frequency 11/30/2022   Vaginal atrophy 11/30/2022   Feeling of incomplete bladder emptying 11/30/2022   Pelvic organ prolapse quantification stage 2 cystocele 11/30/2022   Nocturia 11/30/2022   Hip pain, acute, right 04/08/2022   Shoulder pain, left 07/26/2021   Pain in right hand 10/28/2019   Pain in joint of right shoulder 09/24/2019   Weakness of right arm 09/24/2019   Closed bimalleolar fracture of right ankle 05/06/2017   METATARSALGIA 07/19/2008   HAMMER TOE, ACQUIRED 07/19/2008   ANKLE SPRAIN, RIGHT 07/19/2008   KNEE PAIN, LEFT 06/02/2008   ILIOTIBIAL BAND SYNDROME 06/02/2008    PCP: Patrcia Dolly, DO  REFERRING PROVIDER: Loleta Chance, MD  REFERRING DIAG: R39.14 (ICD-10-CM) - Feeling of incomplete bladder emptying N81.10 (ICD-10-CM) - Pelvic organ prolapse quantification stage 2 cystocele R35.1 (ICD-10-CM) - Nocturia  57yo > 2 months postop R hip surgery, stage II pelvic organ prolapse, nocturia, feeling of incomplete bladder emptying. THERAPY DIAG:  No diagnosis found.  Rationale for Evaluation and Treatment: Rehabilitation  ONSET DATE: ***  SUBJECTIVE:                                                                                                                                                                                            SUBJECTIVE STATEMENT: *** Fluid intake:   PAIN:  Are you having pain? {yes/no:20286} NPRS scale: ***/10 Pain location: {pelvic pain location:27098}  Pain type: {type:313116} Pain description: {PAIN DESCRIPTION:21022940}   Aggravating factors: *** Relieving factors: ***  PRECAUTIONS: {Therapy precautions:24002}  RED FLAGS: {PT Red Flags:29287}   WEIGHT BEARING RESTRICTIONS: {Yes ***/No:24003}  FALLS:  Has patient fallen in last 6 months? {fallsyesno:27318}  OCCUPATION: ***  ACTIVITY LEVEL : ***  PLOF: {PLOF:24004}  PATIENT GOALS: ***  PERTINENT HISTORY:  *** Sexual abuse: {Yes/No:304960894}  BOWEL MOVEMENT:  Pain with bowel movement: {yes/no:20286} Type of bowel movement:{PT BM type:27100} Fully empty rectum: {No/Yes:304960894} Leakage: {Yes/No:304960894} Pads: {Yes/No:304960894} Fiber supplement/laxative {YES/NO AS:20300}  URINATION: Pain with urination: {yes/no:20286} Fully empty bladder: {Yes/No:[id number]}*** Stream: {PT urination:27102} Urgency: {YES/NO AS:20300} Frequency: *** Leakage: {PT leakage:27103} Pads: {Yes/No:304960894}  INTERCOURSE:  Ability to have vaginal penetration {YES/NO:21197} Pain with intercourse: {pain with intercourse PA:27099} Dryness{YES/NO AS:20300} Climax: *** Marinoff Scale: ***/3 Laxative:  PREGNANCY: Vaginal deliveries *** Tearing {Yes***/No:304960894} Episiotomy {YES/NO AS:20300} C-section deliveries *** Currently pregnant {Yes***/No:[id number]}  PROLAPSE: {PT prolapse:27101}   OBJECTIVE:  Note: Objective measures were completed at Evaluation unless otherwise noted.  DIAGNOSTIC FINDINGS:  ***  PATIENT SURVEYS:  {rehab surveys:24030}  PFIQ-7: ***  COGNITION: Overall cognitive status: {cognition:24006}     SENSATION: Light touch: {intact/deficits:24005}  LUMBAR SPECIAL TESTS:  {lumbar special test:25242}  FUNCTIONAL TESTS:  {Functional  tests:24029}  GAIT: Assistive device utilized: {Assistive devices:23999} Comments: ***  POSTURE: {posture:25561}   LUMBARAROM/PROM:  A/PROM A/PROM  eval  Flexion   Extension   Right lateral flexion   Left lateral flexion   Right rotation   Left rotation    (Blank rows = not tested)  LOWER EXTREMITY ROM:  {AROM/PROM:27142} ROM Right eval Left eval  Hip flexion    Hip extension    Hip abduction    Hip adduction    Hip internal rotation    Hip external rotation    Knee flexion    Knee extension    Ankle dorsiflexion    Ankle plantarflexion    Ankle inversion    Ankle eversion     (Blank rows = not tested)  LOWER EXTREMITY MMT:  MMT Right eval Left eval  Hip flexion    Hip extension    Hip abduction    Hip adduction    Hip internal rotation    Hip external rotation    Knee flexion    Knee extension    Ankle dorsiflexion    Ankle plantarflexion    Ankle inversion    Ankle eversion     (Blank rows = not tested) PALPATION:   General: ***  Pelvic Alignment: ***  Abdominal: ***                External Perineal Exam: ***                             Internal Pelvic Floor: ***  Patient confirms identification and approves PT to assess internal pelvic floor and treatment {yes/no:20286}  PELVIC MMT:   MMT eval  Vaginal   Internal Anal Sphincter   External Anal Sphincter   Puborectalis   Diastasis Recti   (Blank rows = not tested)        TONE: ***  PROLAPSE: ***  TODAY'S TREATMENT:  DATE: ***  EVAL ***   PATIENT EDUCATION:  Education details: *** Person educated: {Person educated:25204} Education method: {Education Method:25205} Education comprehension: {Education Comprehension:25206}  HOME EXERCISE PROGRAM: ***  ASSESSMENT:  CLINICAL IMPRESSION: Patient is a *** y.o. *** who was seen today for physical  therapy evaluation and treatment for ***.   OBJECTIVE IMPAIRMENTS: {opptimpairments:25111}.   ACTIVITY LIMITATIONS: {activitylimitations:27494}  PARTICIPATION LIMITATIONS: {participationrestrictions:25113}  PERSONAL FACTORS: {Personal factors:25162} are also affecting patient's functional outcome.   REHAB POTENTIAL: {rehabpotential:25112}  CLINICAL DECISION MAKING: {clinical decision making:25114}  EVALUATION COMPLEXITY: {Evaluation complexity:25115}   GOALS: Goals reviewed with patient? {yes/no:20286}  SHORT TERM GOALS: Target date: ***  *** Baseline: Goal status: INITIAL  2.  *** Baseline:  Goal status: INITIAL  3.  *** Baseline:  Goal status: INITIAL  4.  *** Baseline:  Goal status: INITIAL  5.  *** Baseline:  Goal status: INITIAL  6.  *** Baseline:  Goal status: INITIAL  LONG TERM GOALS: Target date: ***  *** Baseline:  Goal status: INITIAL  2.  *** Baseline:  Goal status: INITIAL  3.  *** Baseline:  Goal status: INITIAL  4.  *** Baseline:  Goal status: INITIAL  5.  *** Baseline:  Goal status: INITIAL  6.  *** Baseline:  Goal status: INITIAL  PLAN:  PT FREQUENCY: {rehab frequency:25116}  PT DURATION: {rehab duration:25117}  PLANNED INTERVENTIONS: {rehab planned interventions:25118::"97110-Therapeutic exercises","97530- Therapeutic [phone]- Neuromuscular re-education","97535- Self JXBJ","47829- Manual therapy"}  PLAN FOR NEXT SESSION: ***   Alnisa Hasley, PT 05/08/2023, 7:03 PM

## 2023-05-09 ENCOUNTER — Other Ambulatory Visit: Payer: Self-pay

## 2023-05-09 ENCOUNTER — Ambulatory Visit: Payer: BC Managed Care – PPO | Attending: Obstetrics | Admitting: Physical Therapy

## 2023-05-09 ENCOUNTER — Encounter: Payer: Self-pay | Admitting: Physical Therapy

## 2023-05-09 DIAGNOSIS — R278 Other lack of coordination: Secondary | ICD-10-CM | POA: Diagnosis not present

## 2023-05-09 DIAGNOSIS — M6281 Muscle weakness (generalized): Secondary | ICD-10-CM | POA: Diagnosis not present

## 2023-05-14 ENCOUNTER — Ambulatory Visit: Admitting: Physical Therapy

## 2023-05-21 ENCOUNTER — Ambulatory Visit: Payer: Self-pay | Admitting: Physical Therapy

## 2023-05-21 ENCOUNTER — Encounter: Payer: Self-pay | Admitting: Physical Therapy

## 2023-05-21 DIAGNOSIS — R278 Other lack of coordination: Secondary | ICD-10-CM

## 2023-05-21 DIAGNOSIS — M6281 Muscle weakness (generalized): Secondary | ICD-10-CM

## 2023-05-21 NOTE — Therapy (Signed)
 OUTPATIENT PHYSICAL THERAPY FEMALE PELVIC TREATMENT   Patient Name: Nicole Carlson MRN: [id number] DOB:December 02, 1965, 58 y.o., female Today's Date: 05/21/2023  END OF SESSION:  PT End of Session - 05/21/23 1407     Visit Number 2    Authorization Type bcbs [id number]  no auth req    Authorization Time Period 11/08/2023    PT Start Time 1400    PT Stop Time 1440    PT Time Calculation (min) 40 min    Activity Tolerance Patient tolerated treatment well    Behavior During Therapy [facility] for tasks assessed/performed              Past Medical History:  Diagnosis Date   Broken ankle    right   High risk HPV infection 10/13   neg pap, +HR HPV, 16/18 genotype neg   LGSIL (low grade squamous intraepithelial dysplasia)    12/14 HPV HR not detected   Stress fracture    Past Surgical History:  Procedure Laterality Date   ANKLE SURGERY     times 2, right ankle   COLPOSCOPY     HTA (ablation)  08/10/04   REFRACTIVE SURGERY  2003   TUBAL LIGATION     BTSP   Patient Active Problem List   Diagnosis Date Noted   Urinary frequency 11/30/2022   Vaginal atrophy 11/30/2022   Feeling of incomplete bladder emptying 11/30/2022   Pelvic organ prolapse quantification stage 2 cystocele 11/30/2022   Nocturia 11/30/2022   Hip pain, acute, right 04/08/2022   Shoulder pain, left 07/26/2021   Pain in right hand 10/28/2019   Pain in joint of right shoulder 09/24/2019   Weakness of right arm 09/24/2019   Closed bimalleolar fracture of right ankle 05/06/2017   METATARSALGIA 07/19/2008   HAMMER TOE, ACQUIRED 07/19/2008   ANKLE SPRAIN, RIGHT 07/19/2008   KNEE PAIN, LEFT 06/02/2008   ILIOTIBIAL BAND SYNDROME 06/02/2008    PCP: Jacquelyne Matte, DO  REFERRING PROVIDER: Darlene Ehlers, MD  REFERRING DIAG: R39.14 (ICD-10-CM) - Feeling of incomplete bladder emptying N81.10 (ICD-10-CM) - Pelvic organ prolapse quantification stage 2 cystocele R35.1 (ICD-10-CM) - Nocturia   THERAPY DIAG:  Other  lack of coordination  Muscle weakness (generalized)  Rationale for Evaluation and Treatment: Rehabilitation  ONSET DATE: 10/2022  SUBJECTIVE:                                                                                                                                                                                           SUBJECTIVE STATEMENT: Pt reports that she ordered her silicone cups and has been doing exercises from [employer] to  think about what she is doing before she lifts her grandchildren.     Fluid intake: water, sundrop   PAIN:  Are you having pain? No   PRECAUTIONS: None  RED FLAGS: None   WEIGHT BEARING RESTRICTIONS: No  FALLS:  Has patient fallen in last 6 months? 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  ACTIVITY LEVEL : walks, [employer], on the floor #10 exercises  PLOF: Independent  PATIENT GOALS: to get her breathing right and keep the bladder from getting worse, if it gets worse, she will get a surgery, but not this year  PERTINENT HISTORY:        ANKLE SURGERY   times 2, right ankle   COLPOSCOPY      HTA (ablation)  08/10/04    REFRACTIVE SURGERY  2003    TUBAL LIGATION        Sexual abuse: No  BOWEL MOVEMENT: not really, some constipation  URINATION: Pain with urination: No Fully empty bladder: No Stream:  all over the place Urgency: No Frequency: no- can hold it all day, but does not get deep sleep d/t right lower extremity restless leg symptoms Leakage: none Pads: No  INTERCOURSE: no issues   PREGNANCY: Vaginal deliveries 2 Tearing Yes:  children 56,33  yo PROLAPSE: Bulge   OBJECTIVE:  Note: Objective measures were completed at Evaluation unless otherwise noted.    PFIQ-7: 19  COGNITION: Overall cognitive status: Within functional limits for tasks assessed     SENSATION: Light touch: Appears intact    GAIT: Assistive device utilized: None Comments: fast  POSTURE: decreased lumbar  lordosis   LUMBARAROM/PROM: within functional limitations    LOWER EXTREMITY ROM: within functional limitations    LOWER EXTREMITY MMT:4/5 bilateral hips PALPATION:   General: upper chest breathing, breath holding tendencies  Pelvic Alignment: posterior  Abdominal: low tone,                External Perineal Exam: dry tissues                            Internal Pelvic Floor: weakness present  Patient confirms identification and approves PT to assess internal pelvic floor and treatment yes  PELVIC MMT:   MMT eval  Vaginal 0/5  Internal Anal Sphincter   External Anal Sphincter   Puborectalis   Diastasis Recti no  (Blank rows = not tested)        TONE: low  PROLAPSE: grade 1 bladder in hooklying  TODAY'S TREATMENT:                                                                                                                              DATE: 05/21/23   EVAL see below Neuro reed- transverse abdominis breath with ball press supine Rowing and extensions green theraband Quadruped transverse abdominis breath  Manual- hip scar cupping PATIENT EDUCATION/ there  acts:  Education details: relevant anatomy, exam findings, HEP, expectations of PT Person educated: Patient Education method: Explanation, Demonstration, Tactile cues, Verbal cues, and Handouts Education comprehension: verbalized understanding, returned demonstration, verbal cues required, tactile cues required, and needs further education  HOME EXERCISE PROGRAM: Access Code: [email or web address] URL: [email or web address] Date: 05/09/2023 Prepared by: Jameson Mcburney Embree Brawley  Exercises - Abdominal Press into [place]  - 1 x daily - 7 x weekly - 3 sets - 10 reps - Shoulder extension with resistance - Neutral  - 1 x daily - 7 x weekly - 3 sets - 10 reps - Rowing with Pelvic Floor Contraction  - 1 x daily - 7 x weekly - 3 sets - 10 reps - Quadruped Exhale with Pelvic Floor Contraction  - 1 x daily - 7 x weekly - 3 sets - 10  reps  ASSESSMENT: Pt did well with internal and exercises. She had a little difficulty with coordination, dem pelvic floor weakness and some vaginal dryness as well. Samples of vaginal moisturizers given. She will benefit from cont PT.    CLINICAL IMPRESSION:  OBJECTIVE IMPAIRMENTS: decreased coordination, decreased knowledge of condition, decreased strength, increased fascial restrictions, and improper body mechanics.   ACTIVITY LIMITATIONS: lifting  PARTICIPATION LIMITATIONS: community activity  PERSONAL FACTORS: Time since onset of injury/illness/exacerbation are also affecting patient's functional outcome.   REHAB POTENTIAL: Excellent  CLINICAL DECISION MAKING: Stable/uncomplicated  EVALUATION COMPLEXITY: Low   GOALS: Goals reviewed with patient? Yes  SHORT TERM GOALS: Target date: 06/06/2023    Pt will be independent with HEP.   Baseline: Goal status: INITIAL  2.  Pt will have reduced nocturia by 50% Baseline:  Goal status: INITIAL  3.  Pt will be I with double void Baseline:  Goal status: INITIAL  LONG TERM GOALS: Target date: 11/08/2022  Pt will be independent with advanced HEP.   Baseline:  Goal status: INITIAL  2.  Pt will have good pelvic floor coordination with breath Baseline:  Goal status: INITIAL  3.  Pt will report 0 vaginal pressure with lifting her grandchildren during a 24 hr period Baseline:  Goal status: INITIAL  4.  Pt will get up max 1 time/ night to urinate Baseline:  Goal status: INITIAL  5.  Pt will drain her bladder completely when urinating Baseline:  Goal status: INITIAL  6.  Pt will have improved bilateral hip strength to 5/5 MMT Baseline:  Goal status: INITIAL  PLAN:  PT FREQUENCY: 1-2x/week  PT DURATION: 6 months  PLANNED INTERVENTIONS: 97110-Therapeutic exercises, 97530- Therapeutic activity, 97112- Neuromuscular re-education, 97535- Self Care, 54098- Manual therapy, [phone]- Electrical stimulation (manual),  Patient/Family education, Taping, Dry Needling, Joint mobilization, Joint manipulation, Spinal manipulation, Spinal mobilization, Scar mobilization, Cryotherapy, Moist heat, and Biofeedback  PLAN FOR NEXT SESSION: internal and cont exercises   Dean Wonder, PT 05/21/2023, 2:08 PM

## 2023-05-30 ENCOUNTER — Encounter: Admitting: Physical Therapy

## 2023-06-04 ENCOUNTER — Encounter: Payer: Self-pay | Admitting: Physical Therapy

## 2023-06-13 ENCOUNTER — Ambulatory Visit: Payer: BC Managed Care – PPO | Admitting: Obstetrics

## 2023-06-25 ENCOUNTER — Encounter: Admitting: Physical Therapy

## 2023-06-26 ENCOUNTER — Ambulatory Visit: Payer: BC Managed Care – PPO | Admitting: Nurse Practitioner

## 2023-07-01 ENCOUNTER — Encounter: Admitting: Physical Therapy

## 2023-07-17 ENCOUNTER — Ambulatory Visit: Admitting: Obstetrics

## 2023-07-29 ENCOUNTER — Encounter: Payer: Self-pay | Admitting: Obstetrics

## 2023-07-29 ENCOUNTER — Ambulatory Visit (INDEPENDENT_AMBULATORY_CARE_PROVIDER_SITE_OTHER): Admitting: Obstetrics

## 2023-07-29 DIAGNOSIS — N952 Postmenopausal atrophic vaginitis: Secondary | ICD-10-CM

## 2023-07-29 DIAGNOSIS — R350 Frequency of micturition: Secondary | ICD-10-CM | POA: Diagnosis not present

## 2023-07-29 DIAGNOSIS — N8110 Cystocele, unspecified: Secondary | ICD-10-CM | POA: Diagnosis not present

## 2023-07-29 MED ORDER — ESTRADIOL 0.1 MG/GM VA CREA: TOPICAL_CREAM | VAGINAL | 3 refills | Status: AC

## 2023-07-29 NOTE — Progress Notes (Signed)
 [facility] Return Visit  SUBJECTIVE  History of Present Illness: Nicole Carlson is a 58 y.o. female seen in follow-up for stage II pelvic organ prolapse, nocturia, feeling of incomplete bladder emptying, vaginal atrophy. Plan at last visit was referral to pelvic floor PT, vaginal estrogen.    Just joined the gym, continues walking. Pending to start red light therapy at home R hip surgery 01/08/23 without complications, resumed 3-5 miles walking/day Underwent pelvic floor PT x 2, continues exercises at home Day time voids 7-8/day down from 10, fluctuates with water intake.  Nocturia: 1-2x/night down from 2-3x/night to void due to urge to void and insomnia  Stopped vaginal estrogen after hip surgery, resumed 1g 2x/week with decreased dryness Unchanged vaginal bulge, reports minimal bother BM with Bristol IV stool 4-5x/week, fluctuates with diet Strains 2-3x/week, some relief with squatting position  Past Medical History: Patient  has a past medical history of Broken ankle, High risk HPV infection (10/13), LGSIL (low grade squamous intraepithelial dysplasia), and Stress fracture.   Past Surgical History: She  has a past surgical history that includes HTA (ablation) (08/10/04); Tubal ligation; Refractive surgery (2003); Colposcopy; and Ankle surgery.   Medications: She has a current medication list which includes the following prescription(s): cetirizine, cholecalciferol, cyanocobalamin, diclofenac, eq arthritis pain reliever, gabapentin, valacyclovir , and estradiol .   Allergies: Patient has no known allergies.   Social History: Patient  reports that she has never smoked. She has never used smokeless tobacco. She reports that she does not drink alcohol and does not use drugs.     OBJECTIVE     Physical Exam: Vitals:   07/29/23 1455  BP: 125/73  Pulse: 66   Gen: No apparent distress, A&O x 3.  Detailed Urogynecologic Evaluation:  Deferred.       ASSESSMENT  AND PLAN    Ms. Acocella is a 58 y.o. with:  1. Urinary frequency   2. Vaginal atrophy   3. Pelvic organ prolapse quantification stage 2 cystocele     Urinary frequency Assessment & Plan: - 11/30/22 POCT UA + uro only, bladder scan 56mL with no sign or symptom of urinary retention - We discussed the symptoms of urinary urgency, urinary frequency, nocturia, with or without urge incontinence.  While we do not know the exact etiology of OAB, several treatment options exist. We discussed management including behavioral therapy (decreasing bladder irritants, urge suppression strategies, timed voids, bladder retraining), physical therapy, medication; for refractory cases posterior tibial nerve stimulation, sacral neuromodulation, and intravesical botulinum toxin injection.  - underwent pelvic floor PT x 2 and continues exercises at home, decline return to PT due to commute - encouraged to continue home exercises and vaginal estrogen due to decrease in symptoms - desires to continue expectant management at this time  Orders: -     Estradiol ; Place 0.5g two to three times a week  Dispense: 30 g; Refill: 3  Vaginal atrophy Assessment & Plan: - For symptomatic vaginal atrophy options include lubrication with a water-based lubricant, personal hygiene measures and barrier protection against wetness, and estrogen replacement in the form of vaginal cream, vaginal tablets, or a time-released vaginal ring.   - resumed vaginal estrogen after R hip surgery - Rx to continue vaginal estrogen 1g 2x/week   Orders: -     Estradiol ; Place 0.5g two to three times a week  Dispense: 30 g; Refill: 3  Pelvic organ prolapse quantification stage 2 cystocele Assessment & Plan: - For treatment of pelvic organ prolapse, we  discussed options for management including expectant management, conservative management, and surgical management, such as Kegels, a pessary, pelvic floor physical therapy, and specific surgical  procedures. - Patient valsalva during Kegel exercise on prior exam - underwent pelvic floor PT x 2 - pt would like to avoid surgical intervention and continue home pelvic floor exercises at this time - discussed splinting as needed to avoid straining for bowel movements, discussed fiber supplementation to reduce valsalva and encouraged pelvic floor relaxation during defecation - continue squatting position for defecation  Orders: -     Estradiol ; Place 0.5g two to three times a week  Dispense: 30 g; Refill: 3  Time spent: I spent 24 minutes dedicated to the care of this patient on the date of this encounter to include pre-visit review of records, face-to-face time with the patient discussing stage II pelvic organ prolapse, urinary frequency, vaginal atrophy, and post visit documentation and ordering medication/ testing.    Lianne ONEIDA Gillis, MD

## 2023-07-29 NOTE — Assessment & Plan Note (Signed)
-   For treatment of pelvic organ prolapse, we discussed options for management including expectant management, conservative management, and surgical management, such as Kegels, a pessary, pelvic floor physical therapy, and specific surgical procedures. - Patient valsalva during Kegel exercise on prior exam - underwent pelvic floor PT x 2 - pt would like to avoid surgical intervention and continue home pelvic floor exercises at this time - discussed splinting as needed to avoid straining for bowel movements, discussed fiber supplementation to reduce valsalva and encouraged pelvic floor relaxation during defecation - continue squatting position for defecation

## 2023-07-29 NOTE — Assessment & Plan Note (Signed)
-   For symptomatic vaginal atrophy options include lubrication with a water-based lubricant, personal hygiene measures and barrier protection against wetness, and estrogen replacement in the form of vaginal cream, vaginal tablets, or a time-released vaginal ring.   - resumed vaginal estrogen after R hip surgery - Rx to continue vaginal estrogen 1g 2x/week

## 2023-07-29 NOTE — Patient Instructions (Addendum)
 Continue vaginal estrogen 0.5g 2-3x/week.   Please return if you experience any change in urinary or vaginal symptoms.   Constipation: Our goal is to achieve formed bowel movements daily or every-other-day.  You may need to try different combinations of the following options to find what works best for you - everybody's body works differently so feel free to adjust the dosages as needed.  Some options to help maintain bowel health include:  Dietary changes (more leafy greens, vegetables and fruits; less processed foods) Fiber supplementation (Benefiber, FiberCon, Metamucil or Psyllium). Start slow and increase gradually to full dose. Over-the-counter agents such as: stool softeners (Docusate or Colace) and/or laxatives (Miralax, milk of magnesia)  Power Pudding is a natural mixture that may help your constipation.  To make blend 1 cup applesauce, 1 cup wheat bran, and 3/4 cup prune juice, refrigerate and then take 1 tablespoon daily with a large glass of water as needed.   Women should try to eat at least 21 to 25 grams of fiber a day, while men should aim for 30 to 38 grams a day. You can add fiber to your diet with food or a fiber supplement such as psyllium (metamucil), benefiber, or fibercon.   Here's a look at how much dietary fiber is found in some common foods. When buying packaged foods, check the Nutrition Facts label for fiber content. It can vary among brands.  Fruits Serving size Total fiber (grams)*  Raspberries 1 cup 8.0  Pear 1 medium 5.5  Apple, with skin 1 medium 4.5  Banana 1 medium 3.0  Orange 1 medium 3.0  Strawberries 1 cup 3.0   Vegetables Serving size Total fiber (grams)*  Green peas, boiled 1 cup 9.0  Broccoli, boiled 1 cup chopped 5.0  Turnip greens, boiled 1 cup 5.0  Brussels sprouts, boiled 1 cup 4.0  Potato, with skin, baked 1 medium 4.0  Sweet corn, boiled 1 cup 3.5  Cauliflower, raw 1 cup chopped 2.0  Carrot, raw 1 medium 1.5   Grains Serving size  Total fiber (grams)*  Spaghetti, whole-wheat, cooked 1 cup 6.0  Barley, pearled, cooked 1 cup 6.0  Bran flakes 3/4 cup 5.5  Quinoa, cooked 1 cup 5.0  Oat bran muffin 1 medium 5.0  Oatmeal, instant, cooked 1 cup 5.0  Popcorn, air-popped 3 cups 3.5  Brown rice, cooked 1 cup 3.5  Bread, whole-wheat 1 slice 2.0  Bread, rye 1 slice 2.0   Legumes, nuts and seeds Serving size Total fiber (grams)*  Split peas, boiled 1 cup 16.0  Lentils, boiled 1 cup 15.5  Black beans, boiled 1 cup 15.0  Baked beans, canned 1 cup 10.0  Chia seeds 1 ounce 10.0  Almonds 1 ounce (23 nuts) 3.5  Pistachios 1 ounce (49 nuts) 3.0  Sunflower kernels 1 ounce 3.0  *Rounded to nearest 0.5 gram. Source: [employer] for [employer]	[place]

## 2023-07-29 NOTE — Assessment & Plan Note (Signed)
-   11/30/22 POCT UA + uro only, bladder scan 56mL with no sign or symptom of urinary retention - We discussed the symptoms of urinary urgency, urinary frequency, nocturia, with or without urge incontinence.  While we do not know the exact etiology of OAB, several treatment options exist. We discussed management including behavioral therapy (decreasing bladder irritants, urge suppression strategies, timed voids, bladder retraining), physical therapy, medication; for refractory cases posterior tibial nerve stimulation, sacral neuromodulation, and intravesical botulinum toxin injection.  - underwent pelvic floor PT x 2 and continues exercises at home, decline return to PT due to commute - encouraged to continue home exercises and vaginal estrogen due to decrease in symptoms - desires to continue expectant management at this time

## 2023-09-05 ENCOUNTER — Encounter: Payer: Self-pay | Admitting: Nurse Practitioner

## 2023-09-05 ENCOUNTER — Ambulatory Visit: Admitting: Nurse Practitioner

## 2023-09-05 VITALS — BP 112/64 | HR 72 | Ht 63.25 in | Wt 131.0 lb

## 2023-09-05 DIAGNOSIS — Z1331 Encounter for screening for depression: Secondary | ICD-10-CM | POA: Diagnosis not present

## 2023-09-05 DIAGNOSIS — Z01419 Encounter for gynecological examination (general) (routine) without abnormal findings: Secondary | ICD-10-CM | POA: Diagnosis not present

## 2023-09-05 DIAGNOSIS — Z780 Asymptomatic menopausal state: Secondary | ICD-10-CM | POA: Diagnosis not present

## 2023-09-05 DIAGNOSIS — N8110 Cystocele, unspecified: Secondary | ICD-10-CM | POA: Diagnosis not present

## 2023-09-05 DIAGNOSIS — N812 Incomplete uterovaginal prolapse: Secondary | ICD-10-CM

## 2023-09-05 NOTE — Progress Notes (Signed)
 Nicole Carlson 1966/01/23 [id number]   History:  58 y.o. G3P2012 presents for annual exam. Postmenopausal - no HRT, no bleeding. Referred to urogyn for pelvic organ prolapse. Did PFPT x 2 and continues to do exercises at home. Doing vaginal estrogen. No change in bulge but not bothersome. 2013 LGSIL. Dad and stepmom currently in ICU after MVA.   Gynecologic History No LMP recorded. Patient has had an ablation.   Contraception/Family planning: post menopausal status Sexually active: Yes  Health Maintenance Last Pap: 06/22/2021. Results were: Normal neg HPV Last mammogram: 05/08/2023. Results were: Normal Last colonoscopy: 2016 Last Dexa: Not indicated     09/05/2023    2:55 PM  Depression screen PHQ 2/9  Decreased Interest 0  Down, Depressed, Hopeless 0  PHQ - 2 Score 0  Altered sleeping 1  Tired, decreased energy 2  Change in appetite 2  Feeling bad or failure about yourself  1  Trouble concentrating 0  Moving slowly or fidgety/restless 1  Suicidal thoughts 0  PHQ-9 Score 7  Difficult doing work/chores Somewhat difficult     Past medical history, past surgical history, family history and social history were all reviewed and documented in the EPIC chart. 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 4 sons (one son passed at age 22), 1 daughter. 2 grandsons.   ROS:  A ROS was performed and pertinent positives and negatives are included.  Exam:  Vitals:   09/05/23 1459  BP: 112/64  Pulse: 72  SpO2: 99%  Weight: 131 lb (59.4 kg)  Height: 5' 3.25 (1.607 m)   Body mass index is 23.02 kg/m.  General appearance:  Normal Thyroid :  Symmetrical, normal in size, without palpable masses or nodularity. Respiratory  Auscultation:  Clear without wheezing or rhonchi Cardiovascular  Auscultation:  Regular rate, without rubs, murmurs or gallops  Edema/varicosities:  Not grossly evident Abdominal  Soft,nontender, without masses, guarding or rebound.  Liver/spleen:  No organomegaly  noted  Hernia:  None appreciated  Skin  Inspection:  Grossly normal Breasts: Examined lying and sitting.   Right: Without masses, retractions, nipple discharge or axillary adenopathy.   Left: Without masses, retractions, nipple discharge or axillary adenopathy. Pelvic: External genitalia:  no lesions              Urethra:  normal appearing urethra with no masses, tenderness or lesions              Bartholins and Skenes: normal                 Vagina: normal appearing vagina with normal color and discharge, no lesions. 1+ uterine prolapse, grade 2-3 cystocele              Cervix: no lesions Bimanual Exam:  Uterus:  no masses or tenderness              Adnexa: no mass, fullness, tenderness              Rectovaginal: Deferred              Anus:  normal, no lesions  Nicole Carlson, CMA present as chaperone.   Assessment/Plan:  58 y.o. [id number] for annual exam.   Well female exam with routine gynecological exam - Education provided on SBEs, importance of preventative screenings, current guidelines, high calcium diet, regular exercise, and multivitamin daily. Labs with PCP.   Postmenopausal  First degree uterine prolapse - sees urgyn  Baden-Walker grade 3 cystocele - asymptomatic, sees urogyn. Did PFPT x  2, using vaginal estrogen. Not interested in surgery at this time.   Screening for cervical cancer - Normal Pap history.  Will repeat at 5-year interval per guidelines.  Screening for breast cancer - Normal mammogram history.  Continue annual screenings.  Normal breast exam today.  Screening for colon cancer - 2016 colonoscopy. Will repeat at 10-year interval per GI's recommendation.   Screening for osteoporosis - Average Carlson. Will plan DXA at age 35.   Return in about 1 year (around 09/04/2024) for Annual.     Nicole DELENA Shutter DNP, 3:24 PM 09/05/2023

## 2024-01-31 ENCOUNTER — Encounter: Payer: Self-pay | Admitting: Nurse Practitioner

## 2024-02-04 ENCOUNTER — Other Ambulatory Visit: Payer: Self-pay

## 2024-02-04 DIAGNOSIS — Z8619 Personal history of other infectious and parasitic diseases: Secondary | ICD-10-CM

## 2024-02-04 MED ORDER — VALACYCLOVIR HCL 500 MG PO TABS: ORAL_TABLET | ORAL | 1 refills | Status: AC

## 2024-02-04 NOTE — Telephone Encounter (Signed)
 Med refill request: valacyclovir  500 mg DX: cold sores Last AEX: 08/007/25 Next AEX: none scheduled Last MMG (if hormonal med) n/a Refill authorized: valacyclovir  500 mg Sent to provider for approval.

## 2024-02-10 ENCOUNTER — Encounter: Payer: Self-pay | Admitting: *Deleted
